# Patient Record
Sex: Female | Born: 1970
Health system: Southern US, Community
[De-identification: ages and names within clinical notes are randomized; demographics above are authoritative.]

## PROBLEM LIST (undated history)

## (undated) DIAGNOSIS — I2699 Other pulmonary embolism without acute cor pulmonale: Secondary | ICD-10-CM

## (undated) DIAGNOSIS — N946 Dysmenorrhea, unspecified: Secondary | ICD-10-CM

## (undated) DIAGNOSIS — F32A Depression, unspecified: Secondary | ICD-10-CM

## (undated) DIAGNOSIS — I10 Essential (primary) hypertension: Secondary | ICD-10-CM

## (undated) DIAGNOSIS — D649 Anemia, unspecified: Secondary | ICD-10-CM

## (undated) DIAGNOSIS — K279 Peptic ulcer, site unspecified, unspecified as acute or chronic, without hemorrhage or perforation: Secondary | ICD-10-CM

## (undated) DIAGNOSIS — K5790 Diverticulosis of intestine, part unspecified, without perforation or abscess without bleeding: Secondary | ICD-10-CM

## (undated) DIAGNOSIS — Z8719 Personal history of other diseases of the digestive system: Secondary | ICD-10-CM

## (undated) DIAGNOSIS — K2270 Barrett's esophagus without dysplasia: Secondary | ICD-10-CM

## (undated) DIAGNOSIS — I82409 Acute embolism and thrombosis of unspecified deep veins of unspecified lower extremity: Secondary | ICD-10-CM

## (undated) DIAGNOSIS — F329 Major depressive disorder, single episode, unspecified: Secondary | ICD-10-CM

## (undated) DIAGNOSIS — K219 Gastro-esophageal reflux disease without esophagitis: Secondary | ICD-10-CM

## (undated) DIAGNOSIS — T7840XA Allergy, unspecified, initial encounter: Secondary | ICD-10-CM

## (undated) DIAGNOSIS — Z98891 History of uterine scar from previous surgery: Secondary | ICD-10-CM

## (undated) HISTORY — DX: Major depressive disorder, single episode, unspecified: F32.9

## (undated) HISTORY — PX: OTHER SURGICAL HISTORY: SHX169

## (undated) HISTORY — PX: TONSILLECTOMY: SUR1361

## (undated) HISTORY — PX: TUBAL LIGATION: SHX77

## (undated) HISTORY — PX: ESOPHAGOGASTRODUODENOSCOPY: SHX1529

## (undated) HISTORY — PX: COLONOSCOPY: SHX174

## (undated) HISTORY — DX: Allergy, unspecified, initial encounter: T78.40XA

## (undated) HISTORY — DX: Gastro-esophageal reflux disease without esophagitis: K21.9

## (undated) HISTORY — PX: ABDOMINAL HYSTERECTOMY: SHX81

## (undated) HISTORY — DX: Depression, unspecified: F32.A

---

## 1998-01-08 HISTORY — PX: LEEP: SHX91

## 2004-08-06 ENCOUNTER — Emergency Department: Payer: Self-pay | Admitting: Emergency Medicine

## 2004-08-16 ENCOUNTER — Ambulatory Visit: Payer: Self-pay | Admitting: Otolaryngology

## 2005-12-12 ENCOUNTER — Ambulatory Visit: Payer: Self-pay | Admitting: Family Medicine

## 2008-05-11 ENCOUNTER — Ambulatory Visit: Payer: Self-pay | Admitting: Obstetrics & Gynecology

## 2008-09-20 ENCOUNTER — Encounter: Payer: Self-pay | Admitting: Obstetrics and Gynecology

## 2008-10-08 ENCOUNTER — Encounter: Payer: Self-pay | Admitting: Obstetrics and Gynecology

## 2008-11-25 ENCOUNTER — Encounter: Payer: Self-pay | Admitting: Obstetrics and Gynecology

## 2008-11-29 ENCOUNTER — Encounter: Payer: Self-pay | Admitting: Maternal and Fetal Medicine

## 2008-12-09 ENCOUNTER — Encounter: Payer: Self-pay | Admitting: Maternal & Fetal Medicine

## 2008-12-16 ENCOUNTER — Encounter: Payer: Self-pay | Admitting: Obstetrics & Gynecology

## 2008-12-20 ENCOUNTER — Encounter: Payer: Self-pay | Admitting: Obstetrics and Gynecology

## 2008-12-27 ENCOUNTER — Encounter: Payer: Self-pay | Admitting: Maternal & Fetal Medicine

## 2008-12-30 ENCOUNTER — Encounter: Payer: Self-pay | Admitting: Maternal & Fetal Medicine

## 2009-01-03 ENCOUNTER — Encounter: Payer: Self-pay | Admitting: Obstetrics and Gynecology

## 2009-01-06 ENCOUNTER — Observation Stay: Payer: Self-pay | Admitting: Obstetrics & Gynecology

## 2009-01-06 ENCOUNTER — Encounter: Payer: Self-pay | Admitting: Maternal & Fetal Medicine

## 2009-01-08 ENCOUNTER — Encounter: Payer: Self-pay | Admitting: Maternal & Fetal Medicine

## 2009-01-13 ENCOUNTER — Encounter: Payer: Self-pay | Admitting: Obstetrics and Gynecology

## 2009-01-19 ENCOUNTER — Inpatient Hospital Stay: Payer: Self-pay | Admitting: Obstetrics & Gynecology

## 2011-03-25 LAB — HM PAP SMEAR: HM Pap smear: NORMAL

## 2011-04-16 LAB — HM MAMMOGRAPHY: HM MAMMO: NORMAL

## 2013-05-08 DIAGNOSIS — Z86711 Personal history of pulmonary embolism: Secondary | ICD-10-CM | POA: Insufficient documentation

## 2013-05-08 DIAGNOSIS — Z86718 Personal history of other venous thrombosis and embolism: Secondary | ICD-10-CM | POA: Insufficient documentation

## 2013-05-12 ENCOUNTER — Inpatient Hospital Stay: Payer: Self-pay | Admitting: Internal Medicine

## 2013-05-12 LAB — IRON AND TIBC
Iron Bind.Cap.(Total): 508 ug/dL — ABNORMAL HIGH (ref 250–450)
Iron Saturation: 3 %
Iron: 15 ug/dL — ABNORMAL LOW (ref 50–170)
Unbound Iron-Bind.Cap.: 493 ug/dL

## 2013-05-12 LAB — PROTIME-INR
INR: 1
Prothrombin Time: 12.8 secs (ref 11.5–14.7)

## 2013-05-12 LAB — COMPREHENSIVE METABOLIC PANEL
ALBUMIN: 3.4 g/dL (ref 3.4–5.0)
ALT: 11 U/L — AB (ref 12–78)
Alkaline Phosphatase: 57 U/L
Anion Gap: 6 — ABNORMAL LOW (ref 7–16)
BUN: 8 mg/dL (ref 7–18)
Bilirubin,Total: 0.2 mg/dL (ref 0.2–1.0)
CHLORIDE: 103 mmol/L (ref 98–107)
Calcium, Total: 8.6 mg/dL (ref 8.5–10.1)
Co2: 27 mmol/L (ref 21–32)
Creatinine: 0.66 mg/dL (ref 0.60–1.30)
EGFR (African American): >60
EGFR (Non-African Amer.): >60
GLUCOSE: 88 mg/dL (ref 65–99)
Osmolality: 270 (ref 275–301)
Potassium: 3.2 mmol/L — ABNORMAL LOW (ref 3.5–5.1)
SGOT(AST): 23 U/L (ref 15–37)
SODIUM: 136 mmol/L (ref 136–145)
Total Protein: 7.7 g/dL (ref 6.4–8.2)

## 2013-05-12 LAB — CBC
HCT: 20.8 % — AB (ref 35.0–47.0)
HGB: 5.9 g/dL — ABNORMAL LOW (ref 12.0–16.0)
MCH: 15.9 pg — ABNORMAL LOW (ref 26.0–34.0)
MCHC: 28.1 g/dL — AB (ref 32.0–36.0)
MCV: 57 fL — ABNORMAL LOW (ref 80–100)
Platelet: 290 10*3/uL (ref 150–440)
RBC: 3.67 10*6/uL — AB (ref 3.80–5.20)
RDW: 19 % — AB (ref 11.5–14.5)
WBC: 8.9 10*3/uL (ref 3.6–11.0)

## 2013-05-12 LAB — APTT: Activated PTT: <23 secs — ABNORMAL LOW (ref 23.6–35.9)

## 2013-05-12 LAB — TROPONIN I

## 2013-05-12 LAB — PRO B NATRIURETIC PEPTIDE: B-TYPE NATIURETIC PEPTID: 144 pg/mL — AB (ref 0–125)

## 2013-05-12 LAB — RETICULOCYTES
ABSOLUTE RETIC COUNT: 0.0716 10*6/uL (ref 0.019–0.186)
Reticulocyte: 2.09 % (ref 0.4–3.1)

## 2013-05-12 LAB — HEMOGLOBIN: HGB: 6.2 g/dL — AB (ref 12.0–16.0)

## 2013-05-12 LAB — FERRITIN: Ferritin (ARMC): 5 ng/mL — ABNORMAL LOW (ref 8–388)

## 2013-05-13 LAB — CBC WITH DIFFERENTIAL/PLATELET
BASOS PCT: 0.6 %
Basophil #: 0.1 10*3/uL (ref 0.0–0.1)
EOS ABS: 0 10*3/uL (ref 0.0–0.7)
Eosinophil %: 0.3 %
HCT: 26.2 % — ABNORMAL LOW (ref 35.0–47.0)
HGB: 7.9 g/dL — ABNORMAL LOW (ref 12.0–16.0)
Lymphocyte #: 1.8 10*3/uL (ref 1.0–3.6)
Lymphocyte %: 16.6 %
MCH: 18.8 pg — AB (ref 26.0–34.0)
MCHC: 30 g/dL — ABNORMAL LOW (ref 32.0–36.0)
MCV: 63 fL — ABNORMAL LOW (ref 80–100)
Monocyte #: 0.6 x10 3/mm (ref 0.2–0.9)
Monocyte %: 5.5 %
Neutrophil #: 8.3 10*3/uL — ABNORMAL HIGH (ref 1.4–6.5)
Neutrophil %: 77 %
Platelet: 272 10*3/uL (ref 150–440)
RBC: 4.17 10*6/uL (ref 3.80–5.20)
RDW: 26.6 % — ABNORMAL HIGH (ref 11.5–14.5)
WBC: 10.8 10*3/uL (ref 3.6–11.0)

## 2013-05-13 LAB — BASIC METABOLIC PANEL
ANION GAP: 7 (ref 7–16)
BUN: 8 mg/dL (ref 7–18)
CALCIUM: 8.4 mg/dL — AB (ref 8.5–10.1)
CREATININE: 0.76 mg/dL (ref 0.60–1.30)
Chloride: 105 mmol/L (ref 98–107)
Co2: 25 mmol/L (ref 21–32)
EGFR (African American): >60
EGFR (Non-African Amer.): >60
GLUCOSE: 106 mg/dL — AB (ref 65–99)
OSMOLALITY: 273 (ref 275–301)
Potassium: 3.1 mmol/L — ABNORMAL LOW (ref 3.5–5.1)
SODIUM: 137 mmol/L (ref 136–145)

## 2013-05-13 LAB — APTT
ACTIVATED PTT: 55.8 s — AB (ref 23.6–35.9)
ACTIVATED PTT: 80.7 s — AB (ref 23.6–35.9)

## 2013-05-13 LAB — TSH: Thyroid Stimulating Horm: 2.53 u[IU]/mL

## 2013-05-14 LAB — APTT: ACTIVATED PTT: 92.1 s — AB (ref 23.6–35.9)

## 2013-05-15 LAB — CA 125: CA 125: 11 U/mL (ref 0.0–34.0)

## 2013-05-15 LAB — CANCER ANTIGEN 27.29: CA 27.29: 11.7 U/mL (ref 0.0–38.6)

## 2013-05-15 LAB — CANCER ANTIGEN 19-9: CA 19-9: 1 U/mL (ref 0–35)

## 2013-05-15 LAB — CEA: CEA: 1.1 ng/mL (ref 0.0–4.7)

## 2013-05-18 LAB — PROT IMMUNOELECTROPHORES(ARMC)

## 2013-05-27 HISTORY — PX: ENDOMETRIAL BIOPSY: SHX622

## 2013-05-28 ENCOUNTER — Ambulatory Visit: Payer: Self-pay | Admitting: Internal Medicine

## 2013-05-28 LAB — CBC CANCER CENTER
BASOS PCT: 0.4 %
Basophil #: 0 x10 3/mm (ref 0.0–0.1)
Eosinophil #: 0.1 x10 3/mm (ref 0.0–0.7)
Eosinophil %: 1.3 %
HCT: 30.7 % — ABNORMAL LOW (ref 35.0–47.0)
HGB: 9.6 g/dL — ABNORMAL LOW (ref 12.0–16.0)
Lymphocyte #: 1.6 x10 3/mm (ref 1.0–3.6)
Lymphocyte %: 22.4 %
MCH: 21.7 pg — AB (ref 26.0–34.0)
MCHC: 31.2 g/dL — ABNORMAL LOW (ref 32.0–36.0)
MCV: 70 fL — ABNORMAL LOW (ref 80–100)
Monocyte #: 0.4 x10 3/mm (ref 0.2–0.9)
Monocyte %: 6.1 %
NEUTROS ABS: 5.1 x10 3/mm (ref 1.4–6.5)
Neutrophil %: 69.8 %
PLATELETS: 433 x10 3/mm (ref 150–440)
RBC: 4.42 10*6/uL (ref 3.80–5.20)
RDW: 35.7 % — AB (ref 11.5–14.5)
WBC: 7.4 x10 3/mm (ref 3.6–11.0)

## 2013-06-08 ENCOUNTER — Ambulatory Visit: Payer: Self-pay | Admitting: Internal Medicine

## 2013-06-22 ENCOUNTER — Ambulatory Visit: Payer: Self-pay | Admitting: Obstetrics and Gynecology

## 2013-06-22 LAB — CBC
HCT: 34.8 % — ABNORMAL LOW (ref 35.0–47.0)
HGB: 11.1 g/dL — ABNORMAL LOW (ref 12.0–16.0)
MCH: 25.1 pg — ABNORMAL LOW (ref 26.0–34.0)
MCHC: 32 g/dL (ref 32.0–36.0)
MCV: 78 fL — ABNORMAL LOW (ref 80–100)
Platelet: 317 10*3/uL (ref 150–440)
RBC: 4.44 10*6/uL (ref 3.80–5.20)
RDW: 27.4 % — ABNORMAL HIGH (ref 11.5–14.5)
WBC: 6.8 10*3/uL (ref 3.6–11.0)

## 2013-07-02 ENCOUNTER — Ambulatory Visit: Payer: Self-pay | Admitting: Obstetrics and Gynecology

## 2013-07-13 ENCOUNTER — Ambulatory Visit: Payer: Self-pay | Admitting: Gastroenterology

## 2013-07-16 ENCOUNTER — Ambulatory Visit: Payer: Self-pay | Admitting: Internal Medicine

## 2013-07-20 ENCOUNTER — Ambulatory Visit: Payer: Self-pay | Admitting: Gastroenterology

## 2013-08-08 ENCOUNTER — Ambulatory Visit: Payer: Self-pay | Admitting: Internal Medicine

## 2013-10-27 LAB — LIPID PANEL
Cholesterol: 194 mg/dL (ref 0–200)
HDL: 81 mg/dL — AB (ref 35–70)
LDL CALC: 97 mg/dL
TRIGLYCERIDES: 82 mg/dL (ref 40–160)

## 2013-10-27 LAB — HEMOGLOBIN A1C: Hgb A1c MFr Bld: 5.9 % (ref 4.0–6.0)

## 2013-12-14 ENCOUNTER — Ambulatory Visit: Payer: Self-pay | Admitting: Internal Medicine

## 2014-01-08 ENCOUNTER — Ambulatory Visit: Payer: Self-pay | Admitting: Internal Medicine

## 2014-05-01 NOTE — H&P (Signed)
PATIENT NAME:  Brooke Page, Brooke Page MR#:  [id number] DATE OF BIRTH:  1970-09-11  DATE OF ADMISSION:  05/12/2013  PRIMARY CARE PHYSICIAN: [facility] (It was Dr. Laurey Morale. She does see the PA over there, Alicia Copland)  CHIEF COMPLAINT: Leg pain.   HISTORY OF PRESENT ILLNESS: This is a 44 year old female with history of vitamin D deficiency. She presents with leg pain since Sunday on her car ride over to [facility]. That is a long ride. She has been having leg pain ever since. Her initial leg pain started back in January but with some elevation it went away. She has also been extremely tired for the last few months. She does have very heavy menstrual bleeding. She bleeds for 5 to 6 days, goes through 7 to 8 pads and tampons. She uses them both during that time frame. No complaints of shortness of breath or chest pain. She does have a hard cough. She has been eating ice for the past 6 months, three 16 ounce cups. Leg pain is described as 9/10 in intensity. In the ER, she was found to have a DVT in the left lower extremity and a pulmonary embolism and severe anemia with a hemoglobin of 5.9. Hospitalist services were contacted for further evaluation.   PAST MEDICAL HISTORY: Vitamin D deficiency, anemia in the past with pregnancy but none since.   PAST SURGICAL HISTORY: Two C-sections, tonsillectomy, wisdom teeth.   ALLERGIES: CEFTIN.   MEDICATIONS: As per prescription writer, aspirin 81 mg daily, Claritin p.r.n., vitamin D 2000 international units daily.   SOCIAL HISTORY: No smoking, occasional alcohol. No drug use. Works doing office work, does work at [employer].   FAMILY HISTORY: Brother died at age 63. He was obese and had blood clots. Father with diabetes and hypertension. Mother with breast cancer and hypertension.   REVIEW OF SYSTEMS: CONSTITUTIONAL: Positive for night sweats. Positive for fatigue. Positive for weight loss, 50 pounds over the past 2 years.  EYES: She does wear contacts.   EARS, NOSE, MOUTH AND THROAT: Positive for runny nose, occasional dysphagia where food gets stuck midway down.  CARDIOVASCULAR: No chest pain. No palpitations.  RESPIRATORY: No shortness of breath. Positive for cough. No sputum. No hemoptysis.  GASTROINTESTINAL: Positive for occasional flank pain, occasional diarrhea. No bright red blood per rectum. No melena.  GENITOURINARY: Positive for heavy menses lasting 5 to 6 days. Goes through 7 to 8 pads and tampons during day 3 and 4. No burning on urination. No hematuria.  MUSCULOSKELETAL: Positive for left leg pain and right finger locking on her.  PSYCHIATRIC: No anxiety or depression.  ENDOCRINE: No thyroid problems.  HEMATOLOGIC AND LYMPHATIC: History of anemia in the past.   PHYSICAL EXAMINATION: VITAL SIGNS: Temperature 98, pulse 107, respirations 20, blood pressure 171/86, pulse ox 100% on room air.  GENERAL: No respiratory distress.  EYES: Conjunctivae pale. Lids normal. Pupils equal, round, and reactive to light. Extraocular muscles intact. No nystagmus.  EARS, NOSE, MOUTH AND THROAT: Tympanic membranes: No erythema. Nasal mucosa: No erythema. Throat: No erythema. No exudate seen. Lips and gums: No lesions.  NECK: No JVD. No bruits. No lymphadenopathy. No thyromegaly. No thyroid nodules palpated.  LUNGS: Clear to auscultation. No use of accessory muscles to breathe. No rhonchi, rales, or wheeze heard.  CARDIOVASCULAR: S1 and S2 tachycardic. No gallops, rubs, or murmurs heard. Carotid upstroke 2+ bilaterally. No bruits. Dorsalis pedis pulses 2+ bilaterally. Positive edema of left lower extremity.  ABDOMEN: Soft, nontender. No organosplenomegaly. Normoactive  bowel sounds. No masses felt.  LYMPHATIC: No lymph nodes in the neck.  MUSCULOSKELETAL: Trace edema of the left lower extremity. No clubbing. No cyanosis.  SKIN: No ulcers or lesions seen.  NEUROLOGIC: Cranial nerves II through XII grossly intact. Deep tendon reflexes 1+ bilateral lower  extremities.  PSYCHIATRIC: The patient is oriented to person, place, and time.   DIAGNOSTIC DATA: White blood cell count 8.9, H and H 5.9 and 20.8, platelet count 290. Glucose 88, BUN 8, creatinine 0.66, sodium 136, potassium 3.2, chloride 103, CO2 27, calcium 8.6. Liver function tests normal range. PT 12.8. INR 1. Troponin negative.   Ultrasound of the left lower extremity showed occlusive popliteal and distal femoral vein DVT.   CT scan of the chest shows acute nonocclusive right-sided pulmonary emboli involving the right lower lobe, segmental pulmonary arteries and right upper lobe lobar artery. No evidence of heart strain or pulmonary infarct. Large hiatal hernia.  ASSESSMENT AND PLAN: 1.  Symptomatic anemia with pica ice eating. ER physician consented for blood transfusion and ordered 1 unit of packed red blood cells. We will send off iron studies before transfusion and start ferrous sulfate. Likely this is secondary to severe menstrual bleeding. Will consult [facility]. I advised the patient not to ice eat. This can cause symptomatic anemia also.  2.  Deep vein thrombosis left lower extremity with severe pain and pulmonary embolism. IV heparin ordered by ER physician. Will send off hypercoagulable work-up. I will get a vascular consultation to consider a filter because I am not sure how good of a candidate this patient is for anticoagulation with her severe anemia and heavy menstrual bleeding. Will start IV heparin for now.  3.  Hypokalemia. Will replace potassium orally.  4.  Allergic rhinitis. Continue Claritin.  5.  Vitamin D deficiency. Continue vitamin D supplementation.   TIME SPENT ON ADMISSION: 55 minutes.   ____________________________ Tana Conch. Leslye Peer, MD rjw:sb D: 05/12/2013 15:30:30 ET T: 05/12/2013 16:57:46 ET JOB#: [id number]  cc: Tana Conch. Leslye Peer, MD, <Dictator> Deirdre Evener. Copland, PA Marisue Brooklyn MD ELECTRONICALLY SIGNED 05/17/2013 14:46

## 2014-05-01 NOTE — Consult Note (Signed)
Consulting Department: MedicinePhysician: Loletha Grayer MD Consulting Question: Menorrhagia History of Present Illness: 44 year old [id number] initially presenting to the ER today for evaluation of increasing left lower extremity pain, worsening over the past few days, starting on 05/09/2013. Patient found to have left lower extremity DVT as well as PE.  In addition patient was noted to be anemic.  She reports heavier periods over the last two years, occurring at regular monthly 28-30 day intervals, 6-7 days duration, going through 7-8 tampons + pads a day, with passage of clots.  Denies moliminal symptoms preceding menses.  She is currently on no hormonal contraceptives. Review of Systems: 10 point review of systems negative unless otherwise noted in HPI Past Medical History: 1) Anemia 2) Pulmonary embolism (current admission) 3) Obesity 4) CIN II Past Surgical History: 1) C-section x 3 2) LEEP 11/1998 3) Tonsillectomy 4) Bilateral tubal ligation Obstetric History: G5P122402/07/1992 preterm vaginal delivery, preeclampsia, female 3lbs04/07/1997 C-section, preeclampsia, female 4lbs02/02/2008 SAB03/01/2008 SAB01/12/2009 C-section, preeclampsia, twins Gynecologic History: LMP 04/22/2013.  Pap smear 04/06/2011 negative for intraepithelial lesion or malignancy with negative HPV.  Leep 11/24/2009 CIN I-II Family History: noteable for breast cancer, diabetes, and hypertension.  Mother also had colon cancer in her 38's the patient has not had a colonoscopy Social History: Denies tobacco, ETOH, or illicit drug use Allergies:  1) Ceftin (Rash) Home Medications: none Physical Exam:VitalNADnormocephalic anictericRRRno increased rate of breathingNABS, soft, non-tender, non-distended, no rebound no guarding, old midline verticle scardeferredLLE edema +1, dorsalis pedis pulses 2+ bilaterally mood appropriate, affect full Labs: 05/12/2013 12:54 WBC 8.9K, H&H 5.9 & 20.8, platelets 290K05/05/2013 12:54 Na 136, K  3.2, Cl 103, CO2 27, BUN 8, Cr 0.66, ALT 11, AST 23 Imaging:extremity Doppler 05/12/2013 11:08 Occlusive popliteal and distal femoral vein DVT left lower extremityangiogram 05/12/2013 right lower lobe segmental pulmonary embolism  Assessment: 44 year old [id number] currently admitted for treatment of pulmonary embolism and left lower extremity DVT, also noted have menorrhagia to anemia requiring transfusion  Plan: 1) Abnormal uterine bleeding ? in setting of active PE/DVT recommend progestin only management.  Per ACOG Committee Opinion #557 April  2013 "Management of Acute Abnormal Uterine Bleeding in Nonpregnant Reproductive-Age Women"  will start on medroxyprogesterone 20mg  po tid x 1 week then 20mg  po daily thereafter.  This should lead to cessation in bleeding within the next 1-2 days.  The most likely diagnosis is abnormal uterine bleeding secondary to anovulation.  Patient not currently bleeding but this regimen should also prevent her from bleeding with her next menses. - Check TSH - Check prolactin - Obtain TVUS (this may be obtained in the outpatient setting as will not change management at present) - Obtain in office endometrial biopsy - We did discuss long term management option including po or IM progestin, Mirena IUD, endometrial ablation pending normal uterine cavity and no structural abnormalities underlying her AUB 2) PE/DVT ? anticoagulation pre primary team 3) Anemia - agree with transfusion of 1U pRBC.  As the patient has a positive family history of colon cancer in her mother while she was in her 59s, and that fact that she is African American would consider colonocsopy for work up anemia.  We discussed that there was a link between endometrial cancer and colon cancer/lynch syndrome    Electronic Signatures: Dorthula Nettles (MD)  (Signed on 05-May-15 21:38)  Authored  Last Updated: 05-May-15 21:38 by Dorthula Nettles (MD)

## 2014-05-01 NOTE — Consult Note (Signed)
PATIENT NAME:  Brooke Page, Brooke Page MR#:  [id number] DATE OF BIRTH:  [birth year]  DATE OF CONSULTATION:  05/13/2013  REFERRING PHYSICIAN:  Dr. Anselm Jungling CONSULTING PHYSICIAN:  Theodore Demark, NP  REASON FOR CONSULTATION: GI consult ordered by Dr. Anselm Jungling for evaluation of anemia.   HISTORY OF PRESENT ILLNESS: I appreciate consult for 44 year old [place] American woman admitted with DVT/PE, status post IVC filter placement for evaluation of IDA. American woman admitted with DVT/PE, status post IVC filter placement for evaluation of IDA. Also has history of significant metromenorrhagia and has had a GYN consult with a planned workup. Mother had history of colon cancer at age 8. The patient states this with localized to a single polyp and that her mother also had breast cancer. The patient has never had colonoscopy. GI-wise, the patient reports intermittent left upper quadrant pain, heartburn and intermittent dysphagia over the last couple of years. States dysphagia located in the mid anterior neck and is generally with the first bite of any type of food. This will pass with a little bit of time. She states she has been using ibuprofen frequently for recent leg pain. States a history of intermittent lower abdominal discomfort with irritable bowel habits ever since she can remember. Denies black, tarry, bloody stools, further GI complaints.   PAST MEDICAL HISTORY: Vitamin D deficiency, anemia with pregnancy.   PAST SURGICAL HISTORY: Two C-sections, tonsillectomy, wisdom tooth removal.  ALLERGIES:  Ceftin  MEDICATIONS:  Aspirin 81 mg p.o. daily, Claritin p.r.n., vitamin D 2000 international units p.o. daily.   SOCIAL HISTORY: No tobacco, rare EtOH. No drugs. 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   FAMILY HISTORY: Mother with colon cancer and breast cancer. No known family history of GYN cancers, brain, stomach or small intestine cancers. Significant additionally for diabetes, hypertension and brother who had blood clots. No known liver disease, ulcers.  REVIEW OF SYSTEMS: Ten systems reviewed.  Significant for fatigue, has lost weight, however states this has not been an unplanned loss. She has been working on this. Occasional congestion, intermittent cough, leg pain as noted on admit consult and heavy menses as noted above. Otherwise, systems review is unremarkable.   LABORATORY DATA: Most recent labs: Glucose 106, iron 15, ferritin 5, iron saturation 3%. TIBC 528, BUN 8, creatinine 0.76. Sodium 137, potassium 3.1, GFR greater than 60, calcium 8.4. Liver panel is really unremarkable with the exception of slightly elevated total protein. Troponin negative. TSH normal. WBC 10.8, hemoglobin 7.9, hematocrit 26.2. Red cells are microcytic. PT-INR normal. Her PTT is 80.7. She is currently being anticoagulated with heparin. B12 normal.  Protein CNS normal. Anticardiolipin antibodies and beta-2 glycoprotein levels are pending. Had a recent echocardiogram with a normal LVEF.   PHYSICAL EXAMINATION: VITAL SIGNS: Most recent: Temperature 98, pulse 87, respiratory rate 18, blood pressure 115/79, SaO2 100% on room air.  GENERAL: Pleasant, well-appearing young lady in no acute distress.  HEENT: Normocephalic, atraumatic. Sclerae clear.  NECK: Supple. No JVD, lymphadenopathy, thyromegaly.  CHEST: Lungs clear to auscultation. Respirations eupneic.  CARDIOVASCULAR: S1, S2. RRR. No MRG. No edema. Peripheral pulses palpable.  ABDOMEN: Flat, soft. Bowel sounds x 4, nondistended, nontender. No hepatosplenomegaly, rebound signs, peritoneal signs, masses or other abnormalities.  RECTAL: Deferred due to her current anticoagulation.  SKIN: Warm, dry, pink. No erythema, lesion or rash.  NEUROLOGICAL: Alert, oriented x 3. Cranial nerves II through XII intact. Speech clear. No facial droop.  PSYCHIATRIC: Pleasant, calm, cooperative, good insight.   IMPRESSION AND PLAN: Iron deficiency anemia, dyspepsia, dysphagia, likely irritable bowel syndrome. Family history of colon cancer.  Recommend  avoiding NSAIDs for now as  she has been using this frequently due to her leg pain. Recommend proton pump inhibitor therapy. I did discuss with Dr. Gustavo Lah regarding her endoscopy, as she at some point will need a colonoscopy and EGD. Her deep vein thrombosis, pulmonary embolus, anticoagulation makes her high risk for invasive sedated procedures. So we will try to plan this when it is clinically feasible. It potentially may be done as an outpatient. Her heavy menses may also contribute to her iron deficiency anemia. Her last period was 04/22/2013.  Thank you very much for this consult.  These services were provided by Stephens November, MSN, [facility], in collaboration with Loistine Simas, M.D. with whom I have discussed this patient in full.   ____________________________ Theodore Demark, NP chl:ce D: 05/13/2013 17:42:34 ET T: 05/13/2013 17:59:15 ET JOB#: [id number]  cc: Theodore Demark, NP, <Dictator> [place] SIGNED 05/26/2013 16:02

## 2014-05-01 NOTE — Op Note (Signed)
PATIENT NAME:  Brooke Page, Brooke Page MR#:  [id number] DATE OF BIRTH:  [birth year]  DATE OF PROCEDURE:  07/02/2013  PREOPERATIVE DIAGNOSIS: Menorrhagia.   POSTOPERATIVE DIAGNOSIS: Menorrhagia.  PROCEDURE PERFORMED: Hysteroscopy and NovaSure endometrial ablation.   PRIMARY SURGEON: Stoney Bang. Georgianne Fick, MD  ANESTHESIA USED: General.    ESTIMATED BLOOD LOSS: 10 mL.  OPERATIVE FLUIDS: 750 of crystalloid.   URINE OUTPUT: 100 mL clear urine.   COMPLICATIONS: None.   INTRAOPERATIVE FINDINGS: Normal cavity, normal tubal ostia, normal cervix. Sounding length of 12 cm, cervical length 6.5 cm for a cavity length of 5.5 cm. The cavity width was 5 cm and the burn time was 1 minute 15 seconds. Post-ablation hysteroscopy revealed good coverage of the entire cavity and sparing of the endocervix.   SPECIMENS REMOVED: None.   PATIENT CONDITION FOLLOWING PROCEDURE: Stable.   PROCEDURE IN DETAIL: Risks, benefits, and alternatives were discussed with the patient prior to proceeding to the Operating Room. The patient was taken to the Operating Room where she was placed under general endotracheal anesthesia. She was positioned in the dorsal lithotomy position using Allen stirrups, prepped and draped in the usual sterile fashion. A timeout procedure was performed. Attention was turned to the patient's pelvis. The bladder was straight catheterized with a red rubber catheter. An operative speculum was placed. The anterior lip of the cervix was grasped with a single-tooth tenaculum and sequentially dilated using Pratt dilators. A hysteroscope was then advanced, noting a normal endometrial cavity and cervix. The hysteroscope was removed. The uterus was sounded to 12 cm. Cervical length was noted to be 6.5 cm. Upon placing the NovaSure device into the cavity, the cavity width was noted to be 5 cm. The NovaSure device was activated and passed cavity assessment before starting the burn, which last 1 minute and 15 seconds.  Following this, the NovaSure was removed. Second look hysteroscopy revealed good coverage of the entire uterine cavity with sparing of the endocervix. The tenaculum was removed as was the hysteroscope. Tenaculum sites and cervix were hemostatic. The speculum was removed. Sponge, needle, and instrument counts were correct x2. The patient tolerated the procedure well and was taken to the recovery room in stable condition.    ____________________________ Stoney Bang. Georgianne Fick, MD ams:lm D: 07/06/2013 16:07:32 ET T: 07/07/2013 04:55:13 ET JOB#: [id number]  cc: Stoney Bang. Georgianne Fick, MD, <Dictator> Dorthula Nettles MD ELECTRONICALLY SIGNED 07/11/2013 8:08

## 2014-05-01 NOTE — Consult Note (Signed)
Brief Consult Note: Diagnosis: 1. dvt and pe  2. night sweats   3. iron deficient anemia.   Patient was seen by consultant.   Discussed with Attending MD.   Comments: DISCUSSED WITH MEDICINE AND GI  DICTATED NOTE TO FOLLOW EXAM WAS BENIGN. NO ACUTE SYMPTOMS, LEFT LEG MUCH SMALLER AND LESS TENDER AS PER PATIENT VS YESTERDAY. NO ABDO PAIN, DARK STOOLS, NO CURRENT UGI SYMPTOMS, SOME HX OF INTERMITTENT ABDO SYMPTOMS HAS BEEN REVIEWD BY GI. ALSO 3 WEEKS OF INTERMITENT, MOST BUT NOT ALL NIGNTS, OF NIGHT SWEATS,NEVER HAD BEFORE. GYN HX OF REGULAR HEAVY BLEEDING FOR YRS, SLOWLY PROGRESSIVE SEAKNESS  BASELINE CHEMISTRIES NORMAL. PT NORMAL . PTT, LESS THAN 23SEC. CBC UNREMARKABLE, POLYS SLIGHTLY HIGH, LIKELY REACTIVE. CLOT SYMPTOMS DEVELOPD 24HRS AND PROGRESSED TO CLINICASLLY APPARANT BY 48 HRS, AFTER TAKING CAR TRIP 2HRS EACH WAY. IN [place] THIS YEAR, EXPERIENCED SIMILAR CALF PAIN, BUT THEN RESOLVED AFTER 5 DAYS, AFTER THE SAME TRIP. FH BROTHER BLOOD CLOT, BUT HE WAS IMMOBILE AND OVER 600LBS. FH DOCUMENTED COLON CANCER IN POLYP MOTHER AGE 66, MOTHER ALSO HAD BREAST CANCER.  IMP CLOT, SHORT CAR TRIP TEMPORALLY PRECIPITATING BUT A SOFT EVENT. NEEDS HYPERCOAG W/U, PLUS AGE APPROPRIATE CANCER SCREENING, PLUS FOR GYN ISSUES NEEDS PELIC U/S WITH TRANSVAGINAL, AND FOR NIGHT SWEATS, WITH CURRENT ISSUES, WILL NEED CT ABDOMEN AND PELVIS TO R/O LYMPHOMA. NOTE S/P IVC FILTER PLACED.   THEREFORE NEEDS MULTIPLE STUDIES...Marland Kitchen1. WOULD SWITCH TO XARALTO, AND  W/U CAN BE OUT PATIENT  2. ADD LUPUS INHIBITOR, SIEP, ANA , FUNCTIONAL PROTEIN C AND S TESTING,  AND LATER MAY ADD AT3, TO COAG W/U.  3. ADD TUMOR MARKERS TO W/U, CA 125, CEA, CA 19/9, CA 27/29 4. LATER RECHECK CBC TO R/O LEUKOCYTOSIS  5.MAMMOGRAM  6. PELVIC U/S WITH TRANSVAGINAL. 7. CT ABDO / PELVIS 8.LATER WILL NEED COLONOSCOPY 9. LATER WILL NEED EGD AS PER GI EVALUATION  10. LATER WILL SUGGEST COLARIS TESTING FOR [facility] SYNDROME DUE TO FH  11. AS PER GYN WILL NEED ENDOMETRIAL BX,  TSH, PROLACTIN  12. WOULD ADVISE AS LITTLE AS POSSIBLE HORMONE TX, BUT CURRENTLY PROTECTED RE PE 13. FOR POTENTIAL LATER USE NOTE MIRENA NOT LINKED TO SYSTEMIC CLOT 14. LENGTH OF ANTICOAG AT LEAST 6 MO..F/U 1 WEEK CANCER CENTER.  Electronic Signatures: Dallas Schimke (MD)  (Signed 06-May-15 18:38)  Authored: Brief Consult Note   Last Updated: 06-May-15 18:38 by Dallas Schimke (MD)

## 2014-05-01 NOTE — Consult Note (Signed)
Brief Consult Note: Diagnosis: Symptomatic anemia.   Patient was seen by consultant.   Consult note dictated.   Comments: Appreciate consult for 44 y/o [place] American woman admitted with DVT/PE for evaluation of IDA. Also has history of significant metromennorhagia and has had GYn cx, with planned work up. Mother had history of colon cancer at age 85- states this was localized to a single polyp,& she also had breast cancer. Pt never had cspy. GI wise pt reports intermittent LUQ pain, heartburn, and intermittent dysphagia over the last couple of years. States dysphagia located in mid anterior neck with any type foods on the1st bite. Has been using Ibuprofen frequently for her recent leg pain. States a history of intermittent lower abdominal discomfort with variable bowel habits every since she can remember.  Denies black/tarry/bloody stools, further GI complaints. On exam abdomen soft, ndnt. Rectal deferred at present as she is anticoagulated on heparin with plans to start Xarelto therapy. Patient denies fh of gyn cancers, brain/stomach/small intestine cancers. Impression and plan: IDA. Dyspepsia. Dysphagia. Likely IBS.  Family history of colon cancer. Recomend avoiding NSAID for now, PPI therapy. Will discuss with Dr Guadlupe Spanish regarding endoscopies. Her DVT/PE/anticoagulation therapy makes her a high risk for invasive sedated procedures presently, although she should have EGD/colonoscopy soon. Her heavy menses may also contribute to her IDA. LMP 04/22/13.  Addendum: spoke with Dr Gustavo Lah: will plan for EGD and colonoscopy on serial days, when clinically feasible.  Electronic Signatures: Stephens November H (NP)  (Signed 06-May-15 15:54)  Authored: Brief Consult Note   Last Updated: 06-May-15 15:54 by Theodore Demark (NP)

## 2014-05-01 NOTE — Op Note (Signed)
PATIENT NAME:  Brooke Page, Brooke Page MR#:  [id number] DATE OF BIRTH:  [birth year]  DATE OF PROCEDURE:  05/12/2013  PREOPERATIVE DIAGNOSES:  1.  Deep vein thrombosis lower extremities.  2.  Pulmonary embolism.  3.  Menometrorrhagia.   POSTOPERATIVE DIAGNOSES: 1.  Deep vein thrombosis lower extremities.  2.  Pulmonary embolism.  3.  Menometrorrhagia.   PROCEDURES:  1.  Inferior venacavogram.  2.  Placement of infrarenal inferior vena cava filter, [place] type.   SURGEON: Katha Cabal, M.D.   SEDATION: Versed 6 mg IV.   CONTRAST USED: Isovue 15 mL.   FLUOROSCOPY TIME: 1.0 minutes.   INDICATIONS: [name] is a 44 year old woman who presented with shortness of breath and was found to have pulmonary embolism. Duplex also demonstrated deep vein thrombosis. She is currently undergoing evaluation for heavy vaginal bleeding and therefore was felt to be a very poor candidate for long-term anticoagulation. The risks and benefits for IVC filter placement were reviewed. All questions have been answered. Discussion regarding removal of the filter when appropriate was also undertaken at this time. Risks and benefits were reviewed. The patient agrees to proceed.   DESCRIPTION OF PROCEDURE: The patient is taken to special procedures and placed in the supine position. After adequate sedation with Versed alone has been achieved, ultrasound is placed in a sterile sleeve. Common femoral vein on the right is identified. It is echolucent and compressible indicating patency. Image is recorded for the permanent record. Under real-time visualization after 1% lidocaine has been infiltrated, access is obtained with a micropuncture needle, microwire followed by micro sheath. J-wire does not allow the passage of the delivery sheath, and therefore a straight glide catheter is advanced, followed by an Amplatz wire delivery sheath now advanced without difficulty and is positioned at the confluence of the iliac veins. AP  projection of the vena cava was performed with the markers from the dilator and measurement is made, the cava measures 21.4 mm in the infrarenal segment. Wire is reintroduced. The sheath is advanced to the L2 level, and the [facility] filter is deployed without difficulty. The sheath is pulled, pressure held, and a safeguard is placed. There are no immediate complications.   INTERPRETATION: The inferior vena cava was opacified with a bolus injection of contrast. There are no filling defects. Renal blushes occur at the L1 level. Cava measures 21.4 as noted above and the [facility] filter is deployed with excellent orientation.   ____________________________ Katha Cabal, MD ggs:lf D: 05/13/2013 08:24:49 ET T: 05/13/2013 10:00:31 ET JOB#: [id number]  cc: Katha Cabal, MD, <Dictator> Katha Cabal MD ELECTRONICALLY SIGNED 06/09/2013 12:26

## 2014-05-01 NOTE — Consult Note (Signed)
Chief Complaint:  Subjective/Chief Complaint Please see full GI consult and brief consult note. Paitent admitted with leg pain, found with dvt and pe.  Positive family h/o colon cancer at a young age.  Currently on anticoagulation and s/p IVC filter.  Patient denies n/v.  Has some abdominal discomfort and heartburn and mild dysphagia in the setting of chronic nsaid use.  Does not take ppi or h2ra at home, though uses antiacids.  Denies black or blood in the stool.   Case discussed with Dr Delana Meyer.  Anemia is likely multifactorial, and per patient has menorrhagia.  No othe r GI sx  no evidence of GI bleeding otherwise.  Currently on anticoagulation and in the setting of new PE woudl be best not interrupted.  Would continue a ppi bid as you are, plan for EGD ande colonoscopy as clinically feasible in several months unless there is indication of overt clinically significant GI bleeding.  Following.   VITAL SIGNS/ANCILLARY NOTES: **Vital Signs.:   06-May-15 06:35  Vital Signs Type Blood Transfusion Complete  Temperature Temperature (F) 98.8  Celsius 37.1  Temperature Source oral  Pulse Pulse 80  Respirations Respirations 20  Systolic BP Systolic BP 099  Diastolic BP (mmHg) Diastolic BP (mmHg) 85  Mean BP 99  Pulse Ox % Pulse Ox % 100  Oxygen Delivery Room Air/ 21 %   Electronic Signatures: Loistine Simas (MD)  (Signed 06-May-15 17:30)  Authored: Chief Complaint, VITAL SIGNS/ANCILLARY NOTES   Last Updated: 06-May-15 17:30 by Loistine Simas (MD)

## 2014-05-01 NOTE — Consult Note (Signed)
Chief Complaint:  Subjective/Chief Complaint seen for anemia.  patient hasd a bad episode of heartburn last night.  better now.  no n/v or abdominalpain.   VITAL SIGNS/ANCILLARY NOTES: **Vital Signs.:   07-May-15 08:17  Vital Signs Type Routine  Temperature Temperature (F) 98.5  Celsius 36.9  Pulse Pulse 90  Systolic BP Systolic BP 010  Diastolic BP (mmHg) Diastolic BP (mmHg) 88  Mean BP 103  Pulse Ox % Pulse Ox % 98  Pulse Ox Activity Level  At rest  Oxygen Delivery Room Air/ 21 %   Brief Assessment:  Cardiac Regular   Respiratory clear BS   Gastrointestinal details normal Soft  Nontender  Nondistended   EXTR positive cyanosis/clubbing, negative cyanosis/clubbing   Lab Results: Routine Coag:  07-May-15 04:43   Activated PTT (APTT)  92.1 (A HCT value >55% may artifactually increase the APTT. In one study, the increase was an average of 19%. Reference: "Effect on Routine and Special Coagulation Testing Values of Citrate Anticoagulant Adjustment in Patients with High HCT Values." American Journal of Clinical Pathology 2006;126:400-405.)   Radiology Results: Cardiology:    06-May-15 11:56, Echo Doppler  Echo Doppler   REASON FOR EXAM:      COMMENTS:       PROCEDURE: [facility] - ECHO DOPPLER COMPLETE(TRANSTHOR)  - May 13 2013 11:56AM     RESULT: Echocardiogram Report    Patient Name:   Brooke Page Date of Exam: 05/13/2013  Medical Rec #:  [id number]               Custom1:  Date of Birth:  22-Apr-1970            Height:       65.0 in  Patient Age:    44 years             Weight:       220.0 lb  Patient Gender: F                    BSA:          2.06 m??    Indications: Pulmonary embolism.  Sonographer:    Sherrie Sport RDCS  Referring Phys: Loletha Grayer, Lenna Sciara    Sonographer Comments: [facility]    Summary:   1. Left ventricular ejection fraction, by visual estimation, is 55 to   60%.   2. Normal global left ventricular systolic function.   3. Moderate concentric left  ventricular hypertrophy.   4. Moderately increased left ventricular septal thickness.   5. Mildly dilated left atrium.   6. Mild mitral valve regurgitation.   7. Moderately increased left ventricular posterior wall thickness.  2D AND M-MODE MEASUREMENTS (normal ranges within parentheses):  Left Ventricle:          Normal  IVSd (2D):      1.19 cm (0.7-1.1)  LVPWd (2D):     1.16 cm (0.7-1.1) Aorta/LA:                  Normal  LVIDd (2D):     4.20 cm (3.4-5.7) Aortic Root (2D): 3.10 cm (2.4-3.7)  LVIDs (2D):     3.01 cm           Left Atrium (2D): 4.80 cm (1.9-4.0)  LV FS (2D):     28.3 %   (>25%)  LV EF (2D):     55.1 %   (>50%)  Right Ventricle:                                    RVd (2D):        8.76 cm  LV DIASTOLIC FUNCTION:  MV Peak E: 1.01 m/s E/e' Ratio: 11.00  MV Peak A: 0.76 m/s Decel Time: 215 msec  E/A Ratio: 1.32  SPECTRAL DOPPLER ANALYSIS (where applicable):  Mitral Valve:  MV P1/2 Time: 62.35 msec  MV Area, PHT: 3.53 cm??  Aortic Valve: AoV Max Vel: 1.50 m/s AoV Peak PG: 9.1 mmHg AoV Mean PG:  LVOT Vmax: 1.10 m/s LVOT VTI:  LVOT Diameter: 2.00 cm  AoV Area, Vmax: 2.30 cm?? AoV Area, VTI:  AoV Area, Vmn:  Tricuspid Valve and PA/RV Systolic Pressure: TR Max Velocity: 2.18 m/s RA   Pressure: 5 mmHg RVSP/PASP: 23.9 mmHg  Pulmonic Valve:  PV Max Velocity: 1.15 m/s PV Max PG: 5.3 mmHg PV Mean PG:    PHYSICIAN INTERPRETATION:  Left Ventricle: The left ventricular internal cavity size was normal. LV   septal wall thickness was moderately increased. LVposterior wall   thickness was moderately increased. Moderate concentric left ventricular   hypertrophy. Global LV systolic function was normal. Left ventricular   ejection fraction, by visual estimation, is 55 to 60%.  Right Ventricle: The right ventricular size is normal.  Left Atrium: The left atrium is mildly dilated.  Mitral Valve: The mitral valve is not well seen. Mild mitral valve    regurgitation is seen.  Tricuspid Valve: The tricuspid valve is not well seen. Trivial tricuspid   regurgitation is visualized. The tricuspid regurgitant velocity is 2.18   m/s, and with an assumed right atrial pressure of 5 mmHg, the estimated   right ventricular systolic pressure is normal at 23.9 mmHg.  Aortic Valve: The aortic valve is tricuspid. The aortic valve is   structurally normal, with no evidence of sclerosis or stenosis. No   evidence of aortic valve regurgitation is seen.    [place] MD  Electronically signed by [id number] Bill MD  Signature Date/Time: 05/13/2013/12:53:16 PM  *** Final ***    IMPRESSION: .        Verified By: Teodoro Spray, M.D., MD   Assessment/Plan:  Assessment/Plan:  Assessment 1) anemia, iron def- patient with menorrhagia.  likely multifactorial.  From a GI standpoint, she has frequent symptomatic GERD and a fhx of colon cancer in a primary relative.  She is currently anticoagulated for PE/DVT.  Would continue treatment and GI will see in 3 weeks as o/p to consider for egd and colonoscopy in about 6 weeks as clinically feasible.  Proceedures will need to be off anticoagulation and may require lovenox bridge.  Will discuss further with Dr Inez Pilgrim. Continue bid ppi for GERD.   Plan as above, will sign off reconsult as needed.   Electronic Signatures: Loistine Simas (MD)  (Signed 07-May-15 10:29)  Authored: Chief Complaint, VITAL SIGNS/ANCILLARY NOTES, Brief Assessment, Lab Results, Radiology Results, Assessment/Plan   Last Updated: 07-May-15 10:29 by Loistine Simas (MD)

## 2014-05-01 NOTE — Discharge Summary (Signed)
Dates of Admission and Diagnosis:  Date of Admission 12-May-2013   Date of Discharge 14-May-2013   Admitting Diagnosis anemia, PE   Final Diagnosis Symptomatic anemia- due to manorrhagia- need IUD - per Gyn DVT and PE Hx of Cancerous polyp in mother at age 42, need Gi work up in next 2-3 weeks. hypokalemia    Chief Complaint/History of Present Illness a 44 year old female with history of vitamin D deficiency. She presents with leg pain since _0 [address], [place], [place] [address]           Lindon Romp, MD  [phone] Result(s) reported on 15 May 2013 at 12:48PM.)  CA 27.29 ========== TEST NAME ==========  ========= RESULTS =========  = REFERENCE RANGE =  CA 27.29   Carbohydrate Antigen 19-9 1 ([facility] methodology            [facility]            No: [id number]           [address], [place], [place] [address]           Lindon Romp, MD         [phone] Result(s) reported on 15 May 2013 at 12:48PM.)  General Ref:  05-May-15 16:42   Factor 2 Mutation Analysis ========== TEST NAME ==========  ========= RESULTS =========  = REFERENCE RANGE =  FACTOR 2 MUTATION ANALYS  Factor II, DNA Analysis Factor II, DNA Analysis         [   Final Report         ]                   NEGATIVE No mutation identified.                                                                      . Comment: A point mutation ([id number]) in the factor II (prothrombin) gene is the second most common cause of inherited thrombophilia. The incidence of this mutation in the [place] Caucasian population is about 2% and in the [place] American population it is approximately 0.5%. This mutation is rare in the [place] and Native American population. Being heterozygous for a prothrombin mutation increases the risk for developing venous thrombosis about 2 to 3 times above the general population risk. Being homozygous for the prothrombin gene mutation  increases the relative risk for venous thrombosis further, although it is not yet known how much further the risk is increased.In women heterozygous for the prothrombin gene mutation, the use of estrogen containing oral contraceptives increases the relative risk of venous thrombosis about 16 times and the risk of developing cerebral thrombosis is also significantly increased. In pregnancy the prothrombin gene mutation increases risk for venous thrombosis and may increase risk for stillbirth, placental abruption, pre-eclampsia and fetal growth restriction. If the patient possesses two or more congenital or acquired thrombophilic risk factors, the risk for thrombosis may rise to more than the sum of the risk ratios for the individual mutations. This assay detects only the prothrombin G20210A mutation and does not measure genetic abnormalities elsewhere in the genome. Other thrombotic risk factors may be pursued through systematic clinical laboratory analysis. These factors include the R506Q ([place]) mutation in the Factor V gene, plasma homocysteine levels, as well as testing for deficiencies of antithrombin III, protein C and protein S. Additional Information:         [   Final Report         ]                   Genetic Counselors are available for health care providers to discuss results at [phone]).                                Marland Kitchen  Methodology: DNA analysis of the Factor II gene was performed by PCR amplification followed by restriction analysis. The diagnostic sensitivity is >99% for both. All the tests must be combined with clinical information for the most accurate interpretation. Molecular-based testing is highly accurate, but as in any laboratory test, diagnostic errors may occur.                                                           Marland Kitchen Poort SR, et al. Blood. 1996; 84:1324-4010. Varga EA. Circulation. 2004; 272:Z36-U44. Mervin Hack, et [place]; 19:700-703.                                                           Allison Quarry, PhD Berenice Primas, PhD Jens Som, PhD Broadus John, PhD Alfredo Bach, PhD Norva Riffle, PhD                                                           .               [facility]                   No: [id number]           [address], [place], [place] [address]           Nechama Guard, MD      [phone]   Result(s) reported on 15 May 2013 at 03:48PM.  Protein C and S Panel ========== TEST NAME ==========  ========= RESULTS =========  = REFERENCE RANGE =  PROTEIN C AND S PANEL  PrtCAg+PrtSAg Protein C Antigen               [   94 %                 ]            70-140 Protein S, Total                [   109 %         ]            58-150 Protein S, Free                 [   95 %                 ]            [address]               [facility]            No: [id number]           [address] [place], [place] [address]           Lindon Romp, MD         [phone]   Result(s)  reported on 14 May 2013 at 07:49AM.  Beta 2 Glycoprotein ========== TEST NAME ==========  ========= RESULTS =========  = REFERENCE RANGE =  BETA2 GLYCOPROTEIN  Beta-2 Glycoprotein I Ab,G,A,M Beta-2 Glycoprotein I Ab, IgG   [   <9 GPI IgG units     ]              0-20 The reference interval reflects a3SD or 99th percentile interval, which is thought to represent a potentially clinically significant result in accordance with the International Consensus Statement on the classification criteria for definitive antiphospholipid syndrome (APS). J Thromb Haem 2006;4:295-306. Beta-2 Glycoprotein I Ab, IgA   [   <9 GPI IgA units     ]              0-25 The reference interval reflects a 3SD or 99th percentile interval, which is thought to represent a potentially clinically significant result inaccordance with the International Consensus Statement on the  classification criteria for definitive antiphospholipid syndrome (APS). J Thromb Haem 2006;4:295-306. Beta-2 Glycoprotein I Ab, IgM   [   <9 GPI IgM units     ]              0-32 The reference interval reflects a 3SD or 99th percentile interval, which is thought to represent a potentially clinically significant result in accordance with the International Consensus Statement on the classification criteria for definitive antiphospholipid syndrome (APS). J Thromb Haem 2006;4:295-306.               [facility]            No: [id number]           [address], [place], [place] [address]           Lindon Romp, MD         [phone]   Result(s) reported on 13 May 2013 at 05:48PM.  Anticardiolipin ABS IgG IgM ========== TEST NAME ==========  ========= RESULTS =========  = REFERENCE RANGE =  ANTICARDIOLPN AB IGG,IGM  Anticardiolipin Ab, IgG/M, Qn Anticardiolipin Ab,IgG,Qn       [   <9 GPL U/mL          ]              0-14             Negative:              <15                                          Indeterminate:     15 - 20                                          Low-Med Positive: >20 - 80                                          High Positive:         >80 Anticardiolipin Ab,IgM,Qn       [   <9 MPL U/mL          ]  0-12                                          Negative:              <13                                          Indeterminate:     13 - 20   Low-Med Positive: >20 - 80                                          High Positive:         >80               [facility]            No: [id number]           [address], [place], [place] [address]           Lindon Romp, MD     [phone]   Result(s) reported on 13 May 2013 at 05:48PM.  06-May-15 04:38   Prolactin, Serum ========== TEST NAME ==========  ========= RESULTS =========  = REFERENCE RANGE =  PROLACTIN  Prolactin Prolactin                       [   12.9 ng/mL            ]          4.8-23.3               [facility]            No: [id number]          [address], [place], [place] [address]           Lindon Romp, MD         [phone]   Result(s) reported on 14 May 2013 at 04:19AM.    20:42   ANA w/ Reflex to 5 Conf.Tests ========== TEST NAME ==========  ========= RESULTS =========  = REFERENCE RANGE =  ANA W/REFLEX-5 CONF.TEST  ANA w/Reflex ANA Direct                      [   Negative             ]          Negative               [id number]           [address] [place], [place] [address]           Lindon Romp, MD         [phone]   Result(s) reported on 15 May 2013 at 12:49PM.  Protein Immunoelectrophoresis, Serum ========== TEST NAME ==========  ========= RESULTS =========  = REFERENCE RANGE =  PROT IMMUNOELECTROPHORES   CA-125 ========== TEST NAME ==========  ========= RESULTS =========  = REFERENCE RANGE =  CANCER ANTIGEN 125   Cardiology:  06-May-15 11:56   Echo Doppler REASON FOR EXAM:     COMMENTS:  PROCEDURE: [facility] - ECHO DOPPLER COMPLETE(TRANSTHOR)  - May 13 2013 11:56AM   RESULT: Echocardiogram Report  Patient Name:   Brooke Page Date of Exam: 05/13/2013 Medical Rec #:  [id number]               Custom1: Date of Birth:  02/17/1970            Height:       65.0 in Patient Age:    42 years             Weight:       220.0 lb Patient Gender: F                    BSA:          2.06 m??  Indications: Pulmonary embolism. Sonographer:    Sherrie Sport RDCS Referring Phys: Loletha Grayer, Lenna Sciara  Sonographer Comments: [facility]  Summary:  1. Left ventricular ejection fraction, by visual estimation, is 55 to  60%.  2. Normal global left ventricular systolic function.  3. Moderate concentric left ventricular hypertrophy.  4. Moderately increased left ventricular septal thickness.  5. Mildly dilated left atrium.  6. Mild mitral valve regurgitation.  7. Moderately increased  left ventricular posterior wall thickness. 2D AND M-MODE MEASUREMENTS (normal ranges within parentheses): Left Ventricle:          Normal IVSd (2D):      1.19 cm (0.7-1.1) LVPWd (2D):     1.16 cm (0.7-1.1) Aorta/LA:                  Normal LVIDd (2D):     4.20 cm (3.4-5.7) Aortic Root (2D): 3.10 cm (2.4-3.7) LVIDs (2D):     3.01 cm           Left Atrium (2D): 4.80 cm (1.9-4.0) LV FS (2D):     28.3 %   (>25%) LV EF (2D):     55.1 %   (>50%)                                   Right Ventricle:                                   RVd (2D):        0.45 cm LV DIASTOLIC FUNCTION: MV Peak E: 1.01 m/s E/e' Ratio: 11.00 MV Peak A: 0.76 m/s Decel Time: 215 msec E/A Ratio: 1.32 SPECTRAL DOPPLER ANALYSIS (where applicable): Mitral Valve: MV P1/2 Time: 62.35 msec MV Area, PHT: 3.53 cm?? Aortic Valve: AoV Max Vel: 1.50 m/s AoV Peak PG: 9.1 mmHg AoV Mean PG: LVOT Vmax: 1.10 m/s LVOT VTI:  LVOT Diameter: 2.00 cm AoV Area, Vmax: 2.30 cm?? AoV Area, VTI:  AoV Area, Vmn: Tricuspid Valve and PA/RV Systolic Pressure: TR Max Velocity: 2.18 m/s RA  Pressure: 5 mmHg RVSP/PASP: 23.9 mmHg Pulmonic Valve: PV Max Velocity: 1.15 m/s PV Max PG: 5.3 mmHg PV Mean PG:  PHYSICIAN INTERPRETATION: Left Ventricle: The left ventricular internal cavity size was normal. LV  septal wall thickness was moderately increased. LVposterior wall  thickness was moderately increased. Moderate concentric left ventricular  hypertrophy. Global LV systolic function was normal. Left ventricular  ejection fraction, by visual estimation, is 55 to 60%. Right Ventricle: The right ventricular size is normal. Left Atrium: The left atrium is mildly dilated.  Mitral Valve: The mitral valve is not well seen. Mild mitral valve  regurgitation is seen. Tricuspid Valve: The tricuspid valve is not well seen. Trivial tricuspid  regurgitation is visualized. The tricuspid regurgitant velocity is 2.18  m/s, and with an assumed right atrial pressure of 5  mmHg, the estimated  right ventricular systolic pressure is normal at 23.9 mmHg. Aortic Valve: The aortic valve is tricuspid. The aortic valve is  structurally normal, with no evidence of sclerosis or stenosis. No  evidence of aortic valve regurgitation is seen.  Jordan MD Electronically signed by [id number] Bill MD Signature Date/Time: 05/13/2013/12:53:16 PM *** Final ***  IMPRESSION: .    Verified By: Teodoro Spray, M.D., MD  Routine Chem:  05-May-15 12:54   Result Comment APTT - RESULTS VERIFIED BY REPEAT TESTING.  Result(s) reported on 12 May 2013 at 05:50PM.  Glucose, Serum 88  BUN 8  Creatinine (comp) 0.66  Sodium, Serum 136  Potassium, Serum  3.2  Chloride, Serum 103  CO2, Serum 27  Calcium (Total), Serum 8.6  Anion Gap  6  Osmolality (calc) 270  eGFR (African American) >60  eGFR (Non-African American) >60 (eGFR values <28mL/min/1.73 m2 may be an indication of chronic kidney disease (CKD). Calculated eGFR is useful in patients with stable renal function. The eGFR calculation will not be reliable in acutely ill patients when serum creatinine is changing rapidly. It is not useful in  patients on dialysis. The eGFR calculation may not be applicable to patients at the low and high extremes of body sizes, pregnant women, and vegetarians.)  B-Type Natriuretic Peptide [facility])  144 (Result(s) reported on 12 May 2013 at 01:32PM.)  06-May-15 04:38   Potassium, Serum  3.1  Cardiac:  05-May-15 12:54   Troponin I < 0.02 (0.00-0.05 0.05 ng/mL or less: NEGATIVE  Repeat testing in 3-6 hrs  if clinically indicated. >0.05 ng/mL: POTENTIAL  MYOCARDIAL INJURY. Repeat  testing in 3-6 hrs if  clinically indicated. NOTE: An increase or decrease  of 30% or more on serial  testing suggests a  clinically important change)  Routine Coag:  05-May-15 12:54   Activated PTT (APTT)  < 23.0 (A HCT value >55% may artifactually increase the APTT. In one study, the increase  was an average of 19%. Reference: "Effect on Routine and Special Coagulation Testing Values of Citrate Anticoagulant Adjustment in Patients with High HCT Values." American Journal of Clinical Pathology 2006;126:400-405.)  Prothrombin 12.8  INR 1.0 (INR reference interval applies to patients on anticoagulant therapy. A single INR therapeutic range for coumarins is not optimal for all indications; however, the suggested range for most indications is 2.0 - 3.0. Exceptions to the INR Reference Range may include: Prosthetic heart valves, acute myocardial infarction, prevention of myocardial infarction, and combinations of aspirin and anticoagulant. The need for a higher or lower target INR must be assessed individually. Reference: The Pharmacology and Management of the Vitamin K  antagonists: the seventh ACCP Conference on Antithrombotic and Thrombolytic Therapy. KGMWN.0272 Sept:126 (3suppl): [id number]. A HCT value >55% may artifactually increase the PT.  In one study,  the increase was an average of 25%. Reference:  "Effect on Routine and Special Coagulation Testing Values of Citrate Anticoagulant Adjustment in Patients with High HCT Values." American Journal of Clinical Pathology 2006;126:400-405.)  Routine Hem:  05-May-15 12:54   WBC (CBC) 8.9  RBC (CBC)  3.67  Hemoglobin (CBC)  5.9  Hematocrit (CBC)  20.8  Platelet Count (CBC) 290 (Result(s) reported on  12 May 2013 at 01:22PM.)  MCV  57  MCH  15.9  MCHC  28.1  RDW  19.0    23:02   Hemoglobin (CBC)  6.2 (Result(s) reported on 12 May 2013 at 11:22PM.)  06-May-15 04:38   Hemoglobin (CBC) -    07:44   Hemoglobin (CBC)  7.9   PERTINENT RADIOLOGY STUDIES: [place]:    05-May-15 11:08, [place] Color Flow Doppler Low Extrem Left (Leg)  [place] Color Flow Doppler Low Extrem Left (Leg)   REASON FOR EXAM:    Swelling  COMMENTS:   LMP: Three weeks ago    PROCEDURE: [place]  - US DOPPLER LOW EXTR LEFT  - May 12 2013 11:08AM     CLINICAL DATA:  Pain,  swelling, color changes/ulcerations.    EXAM:  LEFT LOWER EXTREMITY VENOUS DOPPLER ULTRASOUND    TECHNIQUE:  Gray-scale sonography with compression, as well as color and duplex  ultrasound, were performed to evaluate the deep venous system from  the level of the common femoral vein through the popliteal and  proximal calf veins.  COMPARISON:None    FINDINGS:  The distal femoral vein is incompletely compressible, with thrombus  extending into the proximal popliteal vein. Limited flow signal  through the segments on color interrogation. Normal compressibility  of the common femoral vein as well as the proximal calf veins.  Doppler waveforms show normal direction of venous flow, normal  respiratory phasicity. Augmentation was not performed.     IMPRESSION:  1. Occlusive popliteal and distal femoral vein DVT.      Electronically Signed    By: Arne Cleveland M.D.    On: 05/12/2013 12:03         Verified By: Kandis Cocking, M.D.,  LabUnknown:    05-May-15 14:18, CT [facility] Chest with for PE  PACS Image    Pertinent Past History:  Pertinent Past History Vitamin D deficiency, anemia in the past with pregnancy but none since.   Hospital Course:  Hospital Course * Symptomatic anemia with pica ice eating.    Have heavy menstruation- Appreciated Gyn consult.   Received Blood transfusion by ER.  will give Oral iron on d/c.  * Deep vein thrombosis left lower extremity with severe pain and pulmonary embolism. IV heparin    s/p IVC filter.    Called Hematology consult to decide best anticoagulat in her case.   suggested xarelto for 6 mth- lab work up sent.  *menorrhagia- as per GYN- likely anovular cycles.    will do IUD in clinic. *  Hypokalemia. replace potassium orally.  * Allergic rhinitis. Continue Claritin.  * Vitamin D deficiency. Continue vitamin D supplementation.  * Mother had Cancerous polyp in colon at age 93Yr- called GI consult for anemia + this family  hx.    suggest upper and lower GI scope in 2-3 weeks after office visit.   Condition on Discharge Stable   Code Status:  Code Status Full Code   DISCHARGE INSTRUCTIONS HOME MEDS:  Medication Reconciliation: Patient's Home Medications at Discharge:     Medication Instructions  vitamin d3 2000 intl units oral tablet  1 tab(s) orally once a day   loratadine 10 mg oral tablet  1 tab(s) orally once a day, As Needed   oxycodone 5 mg oral tablet  1 tab(s) orally 1 to 4 times a day x 4 days, As Needed, pain , As needed, pain   rivaroxaban 15 mg oral tablet  1 tab(s) orally 2 times  a day (with meals) x 21 days   rivaroxaban 20 mg oral tablet  1 tab(s) orally once a day-s tart after 21 days ( finishing 15 mg BID dose over)   ferrous sulfate 325 mg (65 mg elemental iron) oral tablet  1 tab(s) orally 3 times a day (with meals)    STOP TAKING THE FOLLOWING MEDICATION(S):    aspirin 81 mg oral tablet, chewable: 1 tab(s) orally once a day  Physician's Instructions:  Diet Regular   Activity Limitations As tolerated   Return to Work Not Applicable   Time frame for Follow Up Appointment 1-2 weeks  Dr, Inez Pilgrim   Time frame for Follow Up Appointment 2-4 weeks  Dr, Donnella Sham   Time frame for Follow Up Appointment 1-2 weeks  Gyn clinic.   Other Comments follow as above.   Electronic Signatures: Vaughan Basta (MD)  (Signed [phone] 23:44)  Authored: ADMISSION DATE AND DIAGNOSIS, CHIEF COMPLAINT/HPI, Allergies, PERTINENT LABS, PERTINENT RADIOLOGY STUDIES, PERTINENT PAST HISTORY, HOSPITAL COURSE, DISCHARGE INSTRUCTIONS HOME MEDS, PATIENT INSTRUCTIONS   Last Updated: 10-May-15 23:44 by Vaughan Basta (MD)

## 2014-05-03 ENCOUNTER — Other Ambulatory Visit: Payer: Self-pay | Admitting: Vascular Surgery

## 2014-05-14 ENCOUNTER — Encounter
Admission: RE | Disposition: A | Discharge status: Home / Self Care | Payer: 59 | Source: Ambulatory Visit | Attending: Vascular Surgery

## 2014-05-14 ENCOUNTER — Ambulatory Visit
Admission: RE | Admit: 2014-05-14 | Discharge: 2014-05-14 | Disposition: A | Discharge status: Home / Self Care | Payer: 59 | Source: Ambulatory Visit | Attending: Vascular Surgery | Admitting: Vascular Surgery

## 2014-05-14 ENCOUNTER — Encounter: Payer: Self-pay | Admitting: *Deleted

## 2014-05-14 DIAGNOSIS — Z452 Encounter for adjustment and management of vascular access device: Secondary | ICD-10-CM | POA: Diagnosis present

## 2014-05-14 DIAGNOSIS — Z79899 Other long term (current) drug therapy: Secondary | ICD-10-CM | POA: Insufficient documentation

## 2014-05-14 DIAGNOSIS — I10 Essential (primary) hypertension: Secondary | ICD-10-CM | POA: Insufficient documentation

## 2014-05-14 DIAGNOSIS — Z86718 Personal history of other venous thrombosis and embolism: Secondary | ICD-10-CM | POA: Diagnosis not present

## 2014-05-14 HISTORY — DX: History of uterine scar from previous surgery: Z98.891

## 2014-05-14 HISTORY — DX: Acute embolism and thrombosis of unspecified deep veins of unspecified lower extremity: I82.409

## 2014-05-14 HISTORY — DX: Personal history of other diseases of the digestive system: Z87.19

## 2014-05-14 HISTORY — DX: Essential (primary) hypertension: I10

## 2014-05-14 HISTORY — PX: VENOGRAM: SHX5497

## 2014-05-14 HISTORY — DX: Other pulmonary embolism without acute cor pulmonale: I26.99

## 2014-05-14 HISTORY — PX: PERIPHERAL VASCULAR CATHETERIZATION: SHX172C

## 2014-05-14 LAB — CREATININE, SERUM
Creatinine, Ser: 0.65 mg/dL (ref 0.44–1.00)
GFR calc Af Amer: >60 mL/min (ref >60)
GFR calc non Af Amer: >60 mL/min (ref >60)

## 2014-05-14 LAB — BUN: BUN: 15 mg/dL (ref 6–20)

## 2014-05-14 SURGERY — IVC FILTER REMOVAL
Anesthesia: Moderate Sedation

## 2014-05-14 MED ORDER — SODIUM CHLORIDE 0.9 % IV SOLN
INTRAVENOUS | Status: DC
  Administered 2014-05-14: 08:00:00 via INTRAVENOUS

## 2014-05-14 MED ORDER — FENTANYL CITRATE (PF) 100 MCG/2ML IJ SOLN
INTRAMUSCULAR | Status: AC
  Filled 2014-05-14: qty 2

## 2014-05-14 MED ORDER — CLINDAMYCIN PHOSPHATE 300 MG/50ML IV SOLN
300.0000 mg | Freq: Once | INTRAVENOUS | Status: AC
  Administered 2014-05-14: 300 mg via INTRAVENOUS
  Filled 2014-05-14: qty 50

## 2014-05-14 MED ORDER — LIDOCAINE-EPINEPHRINE (PF) 1 %-1:200000 IJ SOLN
INTRAMUSCULAR | Status: AC
  Filled 2014-05-14: qty 30

## 2014-05-14 MED ORDER — LIDOCAINE-EPINEPHRINE (PF) 1 %-1:200000 IJ SOLN
INTRAMUSCULAR | Status: DC | PRN
  Administered 2014-05-14: 10 mL

## 2014-05-14 MED ORDER — MIDAZOLAM HCL 10 MG/2ML IJ SOLN
2.0000 mg | Freq: Once | INTRAMUSCULAR | Status: AC
  Administered 2014-05-14: 2 mg via INTRAVENOUS

## 2014-05-14 MED ORDER — MIDAZOLAM HCL 5 MG/5ML IJ SOLN
INTRAMUSCULAR | Status: AC
  Filled 2014-05-14: qty 5

## 2014-05-14 MED ORDER — HYDROMORPHONE HCL 1 MG/ML IJ SOLN: 1.0000 mg | Freq: Once | INTRAMUSCULAR | Status: DC | PRN

## 2014-05-14 MED ORDER — IOPAMIDOL (ISOVUE-300) INJECTION 61%
INTRAVENOUS | Status: DC | PRN
Start: 2014-05-14 — End: 2014-05-14
  Administered 2014-05-14: 15 mL via INTRAVENOUS

## 2014-05-14 MED ORDER — MIDAZOLAM HCL 2 MG/2ML IJ SOLN
INTRAMUSCULAR | Status: DC | PRN
  Administered 2014-05-14 (×3): 1 mg via INTRAVENOUS

## 2014-05-14 MED ORDER — CLINDAMYCIN PHOSPHATE 300 MG/50ML IV SOLN
INTRAVENOUS | Status: AC
  Administered 2014-05-14: 300 mg via INTRAVENOUS
  Filled 2014-05-14: qty 50

## 2014-05-14 MED ORDER — ONDANSETRON HCL 4 MG/2ML IJ SOLN: 4.0000 mg | INTRAMUSCULAR | Status: DC | PRN

## 2014-05-14 MED ORDER — FENTANYL CITRATE (PF) 100 MCG/2ML IJ SOLN
INTRAMUSCULAR | Status: DC | PRN
  Administered 2014-05-14 (×2): 50 ug via INTRAVENOUS

## 2014-05-14 MED ORDER — FENTANYL CITRATE (PF) 100 MCG/2ML IJ SOLN
50.0000 ug | Freq: Once | INTRAMUSCULAR | Status: AC
  Administered 2014-05-14: 50 ug via INTRAVENOUS

## 2014-05-14 MED ORDER — HEPARIN (PORCINE) IN NACL 2-0.9 UNIT/ML-% IJ SOLN
INTRAMUSCULAR | Status: AC
  Filled 2014-05-14: qty 500

## 2014-05-14 SURGICAL SUPPLY — 4 items
PACK ANGIOGRAPHY (CUSTOM PROCEDURE TRAY) ×3 IMPLANT
SET VENACAVA FILTER RETRIEVAL (MISCELLANEOUS) ×3 IMPLANT
TOWEL OR 17X26 4PK STRL BLUE (TOWEL DISPOSABLE) ×3 IMPLANT
WIRE G 030X145 068400025406 (WIRE) ×3 IMPLANT

## 2014-05-14 NOTE — Op Note (Signed)
  OPERATIVE NOTE   PROCEDURE: 1. Removal of inferior vena cava filter 2. Inferior venacavogram  PRE-OPERATIVE DIAGNOSIS: History of DVT  POST-OPERATIVE DIAGNOSIS: History of DVT  SURGEON: Katha Cabal, M.D. ASSISTANT(S): None  ANESTHESIA: IV sedation  ESTIMATED BLOOD LOSS: Minimal cc  FINDING(S): 1.  Widely patent IVC thrombus within the filter  SPECIMEN(S):  IVC filter intact  INDICATIONS:   Brooke Page is a 44 y.o. female who presents with history of DVT status post IVC filter placement.  DESCRIPTION: After obtaining full informed written consent, the patient was brought back to the operating room and placed supine upon the operating table.  The patient received IV antibiotics prior to induction.  After obtaining adequate anesthesia, the patient was prepped and draped in the standard fashion appropriate time out is called.    The right neck was then imaged with ultrasound. Ultrasound was placed in a sterile sleeve. Jugular vein was identified it is echolucent and homogeneous indicating patency. 1% lidocaine is then infiltrated under ultrasound visualization and subsequently a Seldinger needle is inserted under real-time ultrasound guidance.  J-wire is then advanced into the inferior vena cava under fluoroscopic guidance. With the tip of the sheath at the confluence of the iliac veins inferior vena caval imaging is performed.  After review of the image the sheath is repositioned to above the filter and the snares introduced. Snares opened and the hook is secured without difficulty. The filter is then collapsed within the sheath and removed without difficulty.  Sheath is removed by pressures held the patient tolerated the procedure well and there were no immediate complications.  Interpretation inferior vena cava is widely patent filter is in place in good position. Filter is removed without incident.   COMPLICATIONS: None  CONDITION: Brooke Page,  M.D. [facility] Office: [phone]   05/14/2014, 9:11 AM

## 2014-05-14 NOTE — Discharge Instructions (Signed)

## 2014-05-19 ENCOUNTER — Encounter: Payer: Self-pay | Admitting: Vascular Surgery

## 2014-06-29 ENCOUNTER — Other Ambulatory Visit: Payer: Self-pay | Admitting: Family Medicine

## 2014-06-29 NOTE — Telephone Encounter (Signed)
Patient requesting refill. 

## 2014-07-01 ENCOUNTER — Other Ambulatory Visit: Payer: Self-pay

## 2014-07-01 MED ORDER — TELMISARTAN-HCTZ 80-25 MG PO TABS: 1.0000 | ORAL_TABLET | Freq: Every day | ORAL | Status: DC

## 2014-07-02 ENCOUNTER — Other Ambulatory Visit: Payer: Self-pay

## 2014-07-02 MED ORDER — TELMISARTAN-HCTZ 80-25 MG PO TABS: 1.0000 | ORAL_TABLET | Freq: Every day | ORAL | Status: DC

## 2014-07-11 ENCOUNTER — Encounter: Payer: Self-pay | Admitting: Family Medicine

## 2014-07-11 DIAGNOSIS — G4700 Insomnia, unspecified: Secondary | ICD-10-CM | POA: Insufficient documentation

## 2014-07-11 DIAGNOSIS — D251 Intramural leiomyoma of uterus: Secondary | ICD-10-CM | POA: Insufficient documentation

## 2014-07-11 DIAGNOSIS — J302 Other seasonal allergic rhinitis: Secondary | ICD-10-CM | POA: Insufficient documentation

## 2014-07-11 DIAGNOSIS — N920 Excessive and frequent menstruation with regular cycle: Secondary | ICD-10-CM | POA: Insufficient documentation

## 2014-07-11 DIAGNOSIS — F324 Major depressive disorder, single episode, in partial remission: Secondary | ICD-10-CM | POA: Insufficient documentation

## 2014-07-11 DIAGNOSIS — Z6710 Type A blood, Rh positive: Secondary | ICD-10-CM | POA: Insufficient documentation

## 2014-07-11 DIAGNOSIS — Z9289 Personal history of other medical treatment: Secondary | ICD-10-CM | POA: Insufficient documentation

## 2014-07-11 DIAGNOSIS — E559 Vitamin D deficiency, unspecified: Secondary | ICD-10-CM | POA: Insufficient documentation

## 2014-07-11 DIAGNOSIS — Z8759 Personal history of other complications of pregnancy, childbirth and the puerperium: Secondary | ICD-10-CM | POA: Insufficient documentation

## 2014-07-11 DIAGNOSIS — Z95828 Presence of other vascular implants and grafts: Secondary | ICD-10-CM | POA: Insufficient documentation

## 2014-07-11 DIAGNOSIS — I340 Nonrheumatic mitral (valve) insufficiency: Secondary | ICD-10-CM | POA: Insufficient documentation

## 2014-07-11 DIAGNOSIS — Z862 Personal history of diseases of the blood and blood-forming organs and certain disorders involving the immune mechanism: Secondary | ICD-10-CM | POA: Insufficient documentation

## 2014-07-11 DIAGNOSIS — E66811 Obesity, class 1: Secondary | ICD-10-CM | POA: Insufficient documentation

## 2014-07-11 DIAGNOSIS — Z87410 Personal history of cervical dysplasia: Secondary | ICD-10-CM | POA: Insufficient documentation

## 2014-07-11 DIAGNOSIS — I10 Essential (primary) hypertension: Secondary | ICD-10-CM | POA: Insufficient documentation

## 2014-07-11 DIAGNOSIS — E669 Obesity, unspecified: Secondary | ICD-10-CM | POA: Insufficient documentation

## 2014-07-11 DIAGNOSIS — K449 Diaphragmatic hernia without obstruction or gangrene: Secondary | ICD-10-CM | POA: Insufficient documentation

## 2014-07-13 ENCOUNTER — Ambulatory Visit (INDEPENDENT_AMBULATORY_CARE_PROVIDER_SITE_OTHER): Payer: 59 | Admitting: Family Medicine

## 2014-07-13 ENCOUNTER — Encounter: Payer: Self-pay | Admitting: Family Medicine

## 2014-07-13 VITALS — BP 130/84 | HR 109 | Temp 98.6°F | Resp 16 | Ht 60.0 in | Wt 236.8 lb

## 2014-07-13 DIAGNOSIS — F418 Other specified anxiety disorders: Secondary | ICD-10-CM

## 2014-07-13 DIAGNOSIS — Z13220 Encounter for screening for lipoid disorders: Secondary | ICD-10-CM

## 2014-07-13 DIAGNOSIS — Z1239 Encounter for other screening for malignant neoplasm of breast: Secondary | ICD-10-CM

## 2014-07-13 DIAGNOSIS — Z803 Family history of malignant neoplasm of breast: Secondary | ICD-10-CM | POA: Diagnosis not present

## 2014-07-13 DIAGNOSIS — Z800 Family history of malignant neoplasm of digestive organs: Secondary | ICD-10-CM | POA: Diagnosis not present

## 2014-07-13 DIAGNOSIS — Z862 Personal history of diseases of the blood and blood-forming organs and certain disorders involving the immune mechanism: Secondary | ICD-10-CM | POA: Diagnosis not present

## 2014-07-13 DIAGNOSIS — I10 Essential (primary) hypertension: Secondary | ICD-10-CM | POA: Diagnosis not present

## 2014-07-13 DIAGNOSIS — G4700 Insomnia, unspecified: Secondary | ICD-10-CM

## 2014-07-13 DIAGNOSIS — E559 Vitamin D deficiency, unspecified: Secondary | ICD-10-CM

## 2014-07-13 DIAGNOSIS — R5383 Other fatigue: Secondary | ICD-10-CM | POA: Diagnosis not present

## 2014-07-13 MED ORDER — SERTRALINE HCL 100 MG PO TABS
100.0000 mg | ORAL_TABLET | Freq: Every day | ORAL | Status: DC
Start: 2014-07-13 — End: 2014-09-23

## 2014-07-13 MED ORDER — TELMISARTAN-HCTZ 80-25 MG PO TABS: 1.0000 | ORAL_TABLET | Freq: Every day | ORAL | Status: DC

## 2014-07-13 MED ORDER — TEMAZEPAM 30 MG PO CAPS
30.0000 mg | ORAL_CAPSULE | Freq: Every evening | ORAL | Status: DC | PRN
Start: 2014-07-13 — End: 2014-10-14

## 2014-07-13 NOTE — Progress Notes (Signed)
Name: Brooke Page   MRN: [id number]    DOB: 09/21/1970   Date:07/13/2014       Progress Note  Subjective  Chief Complaint  Chief Complaint  Patient presents with  . Hypertension  . Gastrophageal Reflux  . Insomnia    6 hrs. of sleep per night  . Depression    HPI  Insomnia: she is taking Temzepam every night, and is able to sleep at least 6 hours uninterrupted, and able to fall back asleep if she wakes during the night.   Depression/Anxiety: she is doing well, feels like she is in remission, more patient with her children, but she does not feel numb. No side effects of medication  GERD: doing well, off pantoprazole, taking otc medication as needed, at most once or twice month, controlled with life style modifications  Iron deficiency anemia: found in May 2015 when she went to [facility] for DVT and PE, hgb was down to 5.9 and was given 2 units of PRBC. She is still taking iron supplementation, we will recheck labs today, no SOB, no chest pain  Patient Active Problem List   Diagnosis Date Noted  . Mitral regurgitation 07/11/2014  . Hiatal hernia without gangrene and obstruction 07/11/2014  . Seasonal allergic rhinitis 07/11/2014  . History of pre-eclampsia 07/11/2014  . Type a blood, rh positive 07/11/2014  . Intramural leiomyoma of uterus 07/11/2014  . History of cervical dysplasia 07/11/2014  . Depression with anxiety 07/11/2014  . Controlled insomnia 07/11/2014  . Menorrhagia with regular cycle 07/11/2014  . Obesity (BMI 30.0-34.9) 07/11/2014  . History of iron deficiency anemia 07/11/2014  . Vitamin D deficiency 07/11/2014  . Hypertension, benign 07/11/2014  . History of blood transfusion 07/11/2014  . History of pulmonary embolism 05/08/2013  . History of DVT (deep vein thrombosis) 05/08/2013    Past Surgical History  Procedure Laterality Date  . Tonsillectomy    . Abdominal hysterectomy    . Peripheral vascular catheterization N/A 05/14/2014    Procedure: ivc filter  removal;  Surgeon: Katha Cabal, MD;  Location: [place] [facility];  Service: Cardiovascular;  Laterality: N/A;  . Venogram N/A 05/14/2014    Procedure: Venogram;  Surgeon: Katha Cabal, MD;  Location: [place] [facility];  Service: Cardiovascular;  Laterality: N/A;  IVC  . Leep  2000  . Cesarean section    . Endometrial biopsy  05/27/13    no hyperplasia or carcionam, weakly proliferative endometrium,stomal and or glandular breakdown- [facility]    Family History  Problem Relation Age of Onset  . Cancer Mother     Colon and Breast  . Hypertension Mother   . Diabetes Father   . Hypertension Father   . Diabetes Brother   . Pulmonary embolism Brother   . Obesity Brother   . Asthma Son     History   Social History  . Marital Status: Married    Spouse Name: N/A  . Number of Children: N/A  . Years of Education: N/A   Occupational History  . Not on file.   Social History Main Topics  . Smoking status: Never Smoker   . Smokeless tobacco: Never Used  . Alcohol Use: 0.0 oz/week    0 Standard drinks or equivalent per week     Comment: Occasional  . Drug Use: No  . Sexual Activity:    Partners: Male    Birth Control/ Protection: None     Comment: Tubligation   Other Topics Concern  .  Not on file   Social History Narrative     Current outpatient prescriptions:  .  cholecalciferol (VITAMIN D) 1000 UNITS tablet, Take 1,000 Units by mouth daily., Disp: , Rfl:  .  ferrous sulfate 325 (65 FE) MG tablet, Take 1 tablet by mouth daily., Disp: , Rfl:  .  loratadine (CLARITIN) 10 MG tablet, Take 1 tablet by mouth as needed., Disp: , Rfl:  .  sertraline (ZOLOFT) 100 MG tablet, Take 1 tablet (100 mg total) by mouth daily., Disp: 30 tablet, Rfl: 2 .  telmisartan-hydrochlorothiazide (MICARDIS HCT) 80-25 MG per tablet, Take 1 tablet by mouth daily., Disp: 30 tablet, Rfl: 2 .  temazepam (RESTORIL) 30 MG capsule, Take 1 capsule (30 mg total) by mouth at bedtime as needed for  sleep., Disp: 30 capsule, Rfl: 2  Allergies  Allergen Reactions  . Cephalosporins   . Ceftin [Cefuroxime Axetil] Rash     ROS Constitutional: Negative for fever , positive for weight change.  Respiratory: Negative for cough and shortness of breath.   Cardiovascular: Negative for chest pain or palpitations.  Gastrointestinal: Negative for abdominal pain, no bowel changes.  Musculoskeletal: Negative for gait problem or joint swelling.  Skin: Negative for rash.  Neurological: Negative for dizziness or headache.  No other specific complaints in a complete review of systems (except as listed in HPI above).  Objective  Filed Vitals:   07/13/14 1409  BP: 130/84  Pulse: 109  Temp: 98.6 F (37 C)  TempSrc: Oral  Resp: 16  Height: 5' (1.524 m)  Weight: 236 lb 12.8 oz (107.412 kg)  SpO2: 98%    Body mass index is 46.25 kg/(m^2).  Physical Exam  Constitutional: Patient appears well-developed and well-nourished. No distress. Obesity HENT: Head: Normocephalic and atraumatic. Ears: B TMs ok, no erythema or effusion; Nose: Nose normal. Mouth/Throat: Oropharynx is clear and moist. No oropharyngeal exudate.  Eyes: Conjunctivae and EOM are normal. Pupils are equal, round, and reactive to light. No scleral icterus.  Neck: Normal range of motion. Neck supple. No JVD present. No thyromegaly present.  Cardiovascular: Normal rate, regular rhythm and normal heart sounds.  No murmur heard. No BLE edema. Pulmonary/Chest: Effort normal and breath sounds normal. No respiratory distress. Abdominal: Soft. no distension. There is no tenderness. no masses Musculoskeletal: Normal range of motion, no joint effusions. No gross deformities Neurological: he is alert and oriented to person, place, and time. No cranial nerve deficit. Coordination, balance, strength, speech and gait are normal.  Skin: Skin is warm and dry. No rash noted. No erythema.  Psychiatric: Patient has a normal mood and affect.  behavior is normal. Judgment and thought content normal.  Recent Results (from the past 2160 hour(s))  BUN     Status: None   Collection Time: 05/14/14  8:00 AM  Result Value Ref Range   BUN 15 6 - 20 mg/dL  Creatinine, serum     Status: None   Collection Time: 05/14/14  8:00 AM  Result Value Ref Range   Creatinine, Ser 0.65 0.44 - 1.00 mg/dL   GFR calc non Af Amer >60 >60 mL/min   GFR calc Af Amer >60 >60 mL/min    Comment: (NOTE) The eGFR has been calculated using the CKD EPI equation. This calculation has not been validated in all clinical situations. eGFR's persistently <60 mL/min signify possible Chronic Kidney Disease.      PHQ2/9: Depression screen PHQ 2/9 07/13/2014  Decreased Interest 0  Down, Depressed, Hopeless 0  PHQ -  2 Score 0    Fall Risk: Fall Risk  07/13/2014  Falls in the past year? No     Assessment & Plan  1. Hypertension, benign At goal  - telmisartan-hydrochlorothiazide (MICARDIS HCT) 80-25 MG per tablet; Take 1 tablet by mouth daily.  Dispense: 30 tablet; Refill: 2 - Comprehensive Metabolic Panel (CMET)  2. Vitamin D deficiency  - Vitamin D (25 hydroxy)  3. History of iron deficiency anemia  - CBC with Differential - Iron - Iron Binding Cap (TIBC) - Ferritin  4. Controlled insomnia  - temazepam (RESTORIL) 30 MG capsule; Take 1 capsule (30 mg total) by mouth at bedtime as needed for sleep.  Dispense: 30 capsule; Refill: 2  5. Depression with anxiety  - sertraline (ZOLOFT) 100 MG tablet; Take 1 tablet (100 mg total) by mouth daily.  Dispense: 30 tablet; Refill: 2  6. Breast cancer screening  - MM Digital Screening; Future  7. Other fatigue  - B12  8. Lipid screening  - Lipid Profile   9. Family history of colon cancer Mother was 24 when diagnosed, patient is already having colonoscopies, so far normal, she asked about genetic testing, and gave her some information about it today  10. Family history of breast cancer Mother  was 32 when diagnosed.

## 2014-08-05 ENCOUNTER — Other Ambulatory Visit: Payer: Self-pay

## 2014-08-05 DIAGNOSIS — I10 Essential (primary) hypertension: Secondary | ICD-10-CM

## 2014-08-06 MED ORDER — TELMISARTAN-HCTZ 80-25 MG PO TABS: 1.0000 | ORAL_TABLET | Freq: Every day | ORAL | Status: DC

## 2014-08-13 ENCOUNTER — Ambulatory Visit
Admission: RE | Admit: 2014-08-13 | Discharge: 2014-08-13 | Disposition: A | Discharge status: Home / Self Care | Payer: 59 | Source: Ambulatory Visit | Attending: Family Medicine | Admitting: Family Medicine

## 2014-08-13 DIAGNOSIS — Z1231 Encounter for screening mammogram for malignant neoplasm of breast: Secondary | ICD-10-CM | POA: Diagnosis present

## 2014-08-13 DIAGNOSIS — Z1239 Encounter for other screening for malignant neoplasm of breast: Secondary | ICD-10-CM

## 2014-08-13 LAB — CBC WITH DIFFERENTIAL/PLATELET
Basophils Absolute: 0 10*3/uL (ref 0.0–0.2)
Basos: 0 %
EOS (ABSOLUTE): 0.1 10*3/uL (ref 0.0–0.4)
Eos: 1 %
HEMATOCRIT: 26.5 % — AB (ref 34.0–46.6)
Hemoglobin: 8.1 g/dL — ABNORMAL LOW (ref 11.1–15.9)
Immature Grans (Abs): 0 10*3/uL (ref 0.0–0.1)
Immature Granulocytes: 0 %
LYMPHS ABS: 1.2 10*3/uL (ref 0.7–3.1)
LYMPHS: 20 %
MCH: 20.8 pg — AB (ref 26.6–33.0)
MCHC: 30.6 g/dL — ABNORMAL LOW (ref 31.5–35.7)
MCV: 68 fL — ABNORMAL LOW (ref 79–97)
MONOS ABS: 0.4 10*3/uL (ref 0.1–0.9)
Monocytes: 7 %
NEUTROS PCT: 72 %
Neutrophils Absolute: 4.2 10*3/uL (ref 1.4–7.0)
Platelets: 375 10*3/uL (ref 150–379)
RBC: 3.89 x10E6/uL (ref 3.77–5.28)
RDW: 17.8 % — AB (ref 12.3–15.4)
WBC: 5.9 10*3/uL (ref 3.4–10.8)

## 2014-08-14 LAB — COMPREHENSIVE METABOLIC PANEL
A/G RATIO: 1.6 (ref 1.1–2.5)
ALK PHOS: 47 IU/L (ref 39–117)
ALT: 9 IU/L (ref 0–32)
AST: 13 IU/L (ref 0–40)
Albumin: 3.9 g/dL (ref 3.5–5.5)
BUN / CREAT RATIO: 15 (ref 9–23)
BUN: 10 mg/dL (ref 6–24)
Bilirubin Total: <0.2 mg/dL (ref 0.0–1.2)
CHLORIDE: 99 mmol/L (ref 97–108)
CO2: 24 mmol/L (ref 18–29)
CREATININE: 0.65 mg/dL (ref 0.57–1.00)
Calcium: 8.9 mg/dL (ref 8.7–10.2)
GFR calc Af Amer: 127 mL/min/{1.73_m2} (ref >59)
GFR calc non Af Amer: 110 mL/min/{1.73_m2} (ref >59)
GLOBULIN, TOTAL: 2.4 g/dL (ref 1.5–4.5)
GLUCOSE: 87 mg/dL (ref 65–99)
POTASSIUM: 4.1 mmol/L (ref 3.5–5.2)
SODIUM: 139 mmol/L (ref 134–144)
TOTAL PROTEIN: 6.3 g/dL (ref 6.0–8.5)

## 2014-08-14 LAB — LIPID PANEL
CHOL/HDL RATIO: 2.7 ratio (ref 0.0–4.4)
Cholesterol, Total: 166 mg/dL (ref 100–199)
HDL: 62 mg/dL (ref >39)
LDL CALC: 89 mg/dL (ref 0–99)
Triglycerides: 73 mg/dL (ref 0–149)
VLDL CHOLESTEROL CAL: 15 mg/dL (ref 5–40)

## 2014-08-14 LAB — VITAMIN B12: Vitamin B-12: 489 pg/mL (ref 211–946)

## 2014-08-14 LAB — IRON AND TIBC
IRON: 11 ug/dL — AB (ref 27–159)
Iron Saturation: 3 % — CL (ref 15–55)
Total Iron Binding Capacity: 430 ug/dL (ref 250–450)
UIBC: 419 ug/dL (ref 131–425)

## 2014-08-14 LAB — VITAMIN D 25 HYDROXY (VIT D DEFICIENCY, FRACTURES): Vit D, 25-Hydroxy: 34 ng/mL (ref 30.0–100.0)

## 2014-08-14 LAB — FERRITIN: Ferritin: 12 ng/mL — ABNORMAL LOW (ref 15–150)

## 2014-08-16 ENCOUNTER — Telehealth: Payer: Self-pay

## 2014-08-16 NOTE — Telephone Encounter (Signed)
Left vm for patient to return my call. °

## 2014-08-16 NOTE — Progress Notes (Signed)
Patient notified and verbalized understanding to take Iron supplement three times daily.

## 2014-08-16 NOTE — Telephone Encounter (Signed)
-----   Message from Steele Sizer, MD sent at 08/14/2014 10:56 AM EDT ----- Lipid panel is within normal limits Sugar , kidney and liver functions are within normal limits, normal B12 and vitamin D, please also look at the addendum for CBC results.  Please notify patient, thank you

## 2014-08-25 LAB — LIPID PANEL
Cholesterol: 164 mg/dL (ref 0–200)
HDL: 62 mg/dL (ref 35–70)
LDL CALC: 79 mg/dL
TRIGLYCERIDES: 115 mg/dL (ref 40–160)

## 2014-08-25 LAB — BASIC METABOLIC PANEL: GLUCOSE: 96 mg/dL

## 2014-08-25 LAB — HEMOGLOBIN A1C: HEMOGLOBIN A1C: 5.4 % (ref 4.0–6.0)

## 2014-09-23 ENCOUNTER — Other Ambulatory Visit: Payer: Self-pay

## 2014-09-23 DIAGNOSIS — F418 Other specified anxiety disorders: Secondary | ICD-10-CM

## 2014-09-23 MED ORDER — SERTRALINE HCL 100 MG PO TABS: 100.0000 mg | ORAL_TABLET | Freq: Every day | ORAL | Status: DC

## 2014-09-23 NOTE — Telephone Encounter (Signed)
Patient requesting refill. 

## 2014-10-14 ENCOUNTER — Ambulatory Visit (INDEPENDENT_AMBULATORY_CARE_PROVIDER_SITE_OTHER): Payer: 59 | Admitting: Family Medicine

## 2014-10-14 ENCOUNTER — Encounter: Payer: Self-pay | Admitting: Family Medicine

## 2014-10-14 VITALS — BP 128/64 | HR 108 | Temp 98.4°F | Resp 18 | Ht 60.0 in | Wt 230.5 lb

## 2014-10-14 DIAGNOSIS — Z23 Encounter for immunization: Secondary | ICD-10-CM

## 2014-10-14 DIAGNOSIS — G4700 Insomnia, unspecified: Secondary | ICD-10-CM

## 2014-10-14 DIAGNOSIS — Z862 Personal history of diseases of the blood and blood-forming organs and certain disorders involving the immune mechanism: Secondary | ICD-10-CM | POA: Diagnosis not present

## 2014-10-14 DIAGNOSIS — E559 Vitamin D deficiency, unspecified: Secondary | ICD-10-CM

## 2014-10-14 DIAGNOSIS — F324 Major depressive disorder, single episode, in partial remission: Secondary | ICD-10-CM

## 2014-10-14 DIAGNOSIS — J302 Other seasonal allergic rhinitis: Secondary | ICD-10-CM | POA: Diagnosis not present

## 2014-10-14 DIAGNOSIS — I10 Essential (primary) hypertension: Secondary | ICD-10-CM | POA: Diagnosis not present

## 2014-10-14 MED ORDER — SERTRALINE HCL 100 MG PO TABS: 100.0000 mg | ORAL_TABLET | Freq: Every day | ORAL | Status: DC

## 2014-10-14 MED ORDER — TELMISARTAN-HCTZ 80-25 MG PO TABS: 1.0000 | ORAL_TABLET | Freq: Every day | ORAL | Status: DC

## 2014-10-14 MED ORDER — TEMAZEPAM 30 MG PO CAPS: 30.0000 mg | ORAL_CAPSULE | Freq: Every evening | ORAL | Status: DC | PRN

## 2014-10-14 MED ORDER — FLUTICASONE PROPIONATE 50 MCG/ACT NA SUSP: 2.0000 | Freq: Every day | NASAL | Status: AC

## 2014-10-14 NOTE — Progress Notes (Signed)
Name: Brooke Page   MRN: [id number]    DOB: 07/04/1970   Date:10/14/2014       Progress Note  Subjective  Chief Complaint  Chief Complaint  Patient presents with  . Medication Management    3 month F/U  . Hypertension  . Allergic Rhinitis     Little bit of Sneezing  . Depression    Well controlled with medication  . Insomnia    Well controlled with medication sleeps 7-8 hours nightly    HPI  HTN: taking bp medication as prescribed and no side effects. BP is at goal, no chest pain, no palpitation, no SOB  Major Depression Mild in partial remission: still grieving the loss of her brother but coping better, no longer has anhedonia.  Occasional crying. The heavy feeling in her chest has improved.  She is taking Zoloft and is tolerating it well. She does not want to change dose of medication . Denies suicidal thoughts or ideation  Insomnia: taking Temazepam and is helping her fall and stay asleep, no side effects  AR: symptoms re-starting, sneezing, nares feels raw, mild congestion and cough when she first wakes up.   Anemia: history of severe iron deficiency anemia last year, went to [facility] with hgb of 5.9%, seen by Dr. Inez Pilgrim and recevied iron infusion in the past and two blood transfusion. She states she is not feeling as tired now, taking ferrous sulfate 3 times daily, also eats an iron rich diet. Had endometrial ablation at the same time, and only occasionally has a spotting. She had a negative colonoscopy   Patient Active Problem List   Diagnosis Date Noted  . Family history of colon cancer 07/13/2014  . Family history of breast cancer 07/13/2014  . Mitral regurgitation 07/11/2014  . Hiatal hernia without gangrene and obstruction 07/11/2014  . Seasonal allergic rhinitis 07/11/2014  . History of pre-eclampsia 07/11/2014  . Type a blood, rh positive 07/11/2014  . Intramural leiomyoma of uterus 07/11/2014  . History of cervical dysplasia 07/11/2014  . Major depression in  partial remission ([place]) 07/11/2014  . Controlled insomnia 07/11/2014  . Menorrhagia with regular cycle 07/11/2014  . Obesity (BMI 30.0-34.9) 07/11/2014  . History of iron deficiency anemia 07/11/2014  . Vitamin D deficiency 07/11/2014  . Hypertension, benign 07/11/2014  . History of blood transfusion 07/11/2014  . History of pulmonary embolism 05/08/2013  . History of DVT (deep vein thrombosis) 05/08/2013    Past Surgical History  Procedure Laterality Date  . Tonsillectomy    . Abdominal hysterectomy    . Peripheral vascular catheterization N/A 05/14/2014    Procedure: ivc filter removal;  Surgeon: Katha Cabal, MD;  Location: [place] [facility];  Service: Cardiovascular;  Laterality: N/A;  . Venogram N/A 05/14/2014    Procedure: Venogram;  Surgeon: Katha Cabal, MD;  Location: [place] [facility];  Service: Cardiovascular;  Laterality: N/A;  IVC  . Leep  2000  . Cesarean section    . Endometrial biopsy  05/27/13    no hyperplasia or carcionam, weakly proliferative endometrium,stomal and or glandular breakdown- [facility]    Family History  Problem Relation Age of Onset  . Cancer Mother     Colon and Breast  . Hypertension Mother   . Breast cancer Mother 59  . Diabetes Father   . Hypertension Father   . Diabetes Brother   . Pulmonary embolism Brother   . Obesity Brother   . Asthma Son     Social  History   Social History  . Marital Status: Married    Spouse Name: N/A  . Number of Children: N/A  . Years of Education: N/A   Occupational History  . Not on file.   Social History Main Topics  . Smoking status: Never Smoker   . Smokeless tobacco: Never Used  . Alcohol Use: 0.0 oz/week    0 Standard drinks or equivalent per week     Comment: Occasional  . Drug Use: No  . Sexual Activity:    Partners: Male    Birth Control/ Protection: None     Comment: Tubligation   Other Topics Concern  . Not on file   Social History Narrative     Current  outpatient prescriptions:  .  cholecalciferol (VITAMIN D) 1000 UNITS tablet, Take 1,000 Units by mouth daily., Disp: , Rfl:  .  ferrous sulfate 325 (65 FE) MG tablet, Take 1 tablet by mouth daily., Disp: , Rfl:  .  loratadine (CLARITIN) 10 MG tablet, Take 1 tablet by mouth as needed., Disp: , Rfl:  .  sertraline (ZOLOFT) 100 MG tablet, Take 1 tablet (100 mg total) by mouth daily., Disp: 90 tablet, Rfl: 1 .  telmisartan-hydrochlorothiazide (MICARDIS HCT) 80-25 MG tablet, Take 1 tablet by mouth daily., Disp: 90 tablet, Rfl: 1 .  temazepam (RESTORIL) 30 MG capsule, Take 1 capsule (30 mg total) by mouth at bedtime as needed for sleep., Disp: 90 capsule, Rfl: 1  Allergies  Allergen Reactions  . Cephalosporins   . Ceftin [Cefuroxime Axetil] Rash     ROS  Constitutional: Negative for fever or weight change.  Respiratory: mild am cough no  shortness of breath.   Cardiovascular: Negative for chest pain or palpitations.  Gastrointestinal: Negative for abdominal pain, no bowel changes.  Musculoskeletal: Negative for gait problem or joint swelling.  Skin: Negative for rash.  Neurological: Negative for dizziness or headache.  No other specific complaints in a complete review of systems (except as listed in HPI above).   Objective  Filed Vitals:   10/14/14 1425  BP: 128/64  Pulse: 108  Temp: 98.4 F (36.9 C)  TempSrc: Oral  Resp: 18  Height: 5' (1.524 m)  Weight: 230 lb 8 oz (104.554 kg)  SpO2: 98%    Body mass index is 45.02 kg/(m^2).  Physical Exam  Constitutional: Patient appears well-developed and well-nourished. Obese No distress.  HEENT: head atraumatic, normocephalic, pupils equal and reactive to light,neck supple, throat within normal limits Cardiovascular: Normal rate, regular rhythm and normal heart sounds.  No murmur heard. No BLE edema. Pulmonary/Chest: Effort normal and breath sounds normal. No respiratory distress. Abdominal: Soft.  There is no  tenderness. Psychiatric: Patient has a normal mood and affect. behavior is normal. Judgment and thought content normal.  Recent Results (from the past 2160 hour(s))  Lipid Profile     Status: None   Collection Time: 08/13/14  9:53 AM  Result Value Ref Range   Cholesterol, Total 166 100 - 199 mg/dL   Triglycerides 73 0 - 149 mg/dL   HDL 62 >39 mg/dL    Comment: According to ATP-III Guidelines, HDL-C >59 mg/dL is considered a negative risk factor for CHD.    VLDL Cholesterol Cal 15 5 - 40 mg/dL   LDL Calculated 89 0 - 99 mg/dL   Chol/HDL Ratio 2.7 0.0 - 4.4 ratio units    Comment:  T. Chol/HDL Ratio                                             Men  Women                               1/2 Avg.Risk  3.4    3.3                                   Avg.Risk  5.0    4.4                                2X Avg.Risk  9.6    7.1                                3X Avg.Risk 23.4   11.0   Comprehensive Metabolic Panel (CMET)     Status: None   Collection Time: 08/13/14  9:53 AM  Result Value Ref Range   Glucose 87 65 - 99 mg/dL   BUN 10 6 - 24 mg/dL   Creatinine, Ser 0.65 0.57 - 1.00 mg/dL   GFR calc non Af Amer 110 >59 mL/min/1.73   GFR calc Af Amer 127 >59 mL/min/1.73   BUN/Creatinine Ratio 15 9 - 23   Sodium 139 134 - 144 mmol/L   Potassium 4.1 3.5 - 5.2 mmol/L   Chloride 99 97 - 108 mmol/L   CO2 24 18 - 29 mmol/L   Calcium 8.9 8.7 - 10.2 mg/dL   Total Protein 6.3 6.0 - 8.5 g/dL   Albumin 3.9 3.5 - 5.5 g/dL   Globulin, Total 2.4 1.5 - 4.5 g/dL   Albumin/Globulin Ratio 1.6 1.1 - 2.5   Bilirubin Total <0.2 0.0 - 1.2 mg/dL   Alkaline Phosphatase 47 39 - 117 IU/L   AST 13 0 - 40 IU/L   ALT 9 0 - 32 IU/L  CBC with Differential     Status: Abnormal   Collection Time: 08/13/14  9:53 AM  Result Value Ref Range   WBC 5.9 3.4 - 10.8 x10E3/uL   RBC 3.89 3.77 - 5.28 x10E6/uL   Hemoglobin 8.1 (L) 11.1 - 15.9 g/dL   Hematocrit 26.5 (L) 34.0 - 46.6 %   MCV 68 (L)  79 - 97 fL   MCH 20.8 (L) 26.6 - 33.0 pg   MCHC 30.6 (L) 31.5 - 35.7 g/dL   RDW 17.8 (H) 12.3 - 15.4 %   Platelets 375 150 - 379 x10E3/uL   Neutrophils 72 %   Lymphs 20 %   Monocytes 7 %   Eos 1 %   Basos 0 %   Neutrophils Absolute 4.2 1.4 - 7.0 x10E3/uL   Lymphocytes Absolute 1.2 0.7 - 3.1 x10E3/uL   Monocytes Absolute 0.4 0.1 - 0.9 x10E3/uL   EOS (ABSOLUTE) 0.1 0.0 - 0.4 x10E3/uL   Basophils Absolute 0.0 0.0 - 0.2 x10E3/uL   Immature Granulocytes 0 %   Immature Grans (Abs) 0.0 0.0 - 0.1 x10E3/uL  B12     Status: None   Collection Time: 08/13/14  9:53 AM  Result Value Ref Range  Vitamin B-12 489 211 - 946 pg/mL  Iron Binding Cap (TIBC)     Status: Abnormal   Collection Time: 08/13/14  9:53 AM  Result Value Ref Range   Total Iron Binding Capacity 430 250 - 450 ug/dL   UIBC 419 131 - 425 ug/dL   Iron 11 (L) 27 - 159 ug/dL   Iron Saturation 3 (LL) 15 - 55 %  Ferritin     Status: Abnormal   Collection Time: 08/13/14  9:53 AM  Result Value Ref Range   Ferritin 12 (L) 15 - 150 ng/mL  Vitamin D (25 hydroxy)     Status: None   Collection Time: 08/13/14  9:53 AM  Result Value Ref Range   Vit D, 25-Hydroxy 34.0 30.0 - 100.0 ng/mL    Comment: Vitamin D deficiency has been defined by the Institute of Medicine and an Endocrine Society practice guideline as a level of serum 25-OH vitamin D less than 20 ng/mL (1,2). The Endocrine Society went on to further define vitamin D insufficiency as a level between 21 and 29 ng/mL (2). 1. IOM (Institute of Medicine). 2010. Dietary reference    intakes for calcium and D. [place]: The    [employer]. 2. Holick MF, Binkley , Bischoff-Ferrari HA, et al.    Evaluation, treatment, and prevention of vitamin D    deficiency: an Endocrine Society clinical practice    guideline. JCEM. 2011 Jul; 96(7):1911-30.   Basic metabolic panel     Status: None   Collection Time: 08/25/14 12:00 AM  Result Value Ref Range   Glucose 96  mg/dL  Lipid panel     Status: None   Collection Time: 08/25/14 12:00 AM  Result Value Ref Range   Triglycerides 115 40 - 160 mg/dL   Cholesterol 164 0 - 200 mg/dL   HDL 62 35 - 70 mg/dL   LDL Cholesterol 79 mg/dL  Hemoglobin A1c     Status: None   Collection Time: 08/25/14 12:00 AM  Result Value Ref Range   Hgb A1c MFr Bld 5.4 4.0 - 6.0 %     PHQ2/9: Depression screen [facility] 2/9 10/14/2014 07/13/2014  Decreased Interest 0 0  Down, Depressed, Hopeless 0 0  PHQ - 2 Score 0 0     Fall Risk: Fall Risk  10/14/2014 07/13/2014  Falls in the past year? Yes No  Number falls in past yr: 1 -  Injury with Fall? No -     Functional Status Survey: Is the patient deaf or have difficulty hearing?: No Does the patient have difficulty seeing, even when wearing glasses/contacts?: Yes (contacts) Does the patient have difficulty concentrating, remembering, or making decisions?: No Does the patient have difficulty walking or climbing stairs?: No Does the patient have difficulty dressing or bathing?: No Does the patient have difficulty doing errands alone such as visiting a doctor's office or shopping?: No   Assessment & Plan   1. Hypertension, benign  - telmisartan-hydrochlorothiazide (MICARDIS HCT) 80-25 MG tablet; Take 1 tablet by mouth daily.  Dispense: 90 tablet; Refill: 1  2. Needs flu shot  - Flu Vaccine QUAD 36+ mos PF IM (Fluarix & Fluzone Quad PF)  3. Controlled insomnia  - temazepam (RESTORIL) 30 MG capsule; Take 1 capsule (30 mg total) by mouth at bedtime as needed for sleep.  Dispense: 90 capsule; Refill: 1  4. Vitamin D deficiency  Normal level in August  5. History of iron deficiency anemia  - CBC with Differential/Platelet -  Ferritin - Iron and TIBC  6. Major depressive disorder with single episode, in partial remission (HCC)  - sertraline (ZOLOFT) 100 MG tablet; Take 1 tablet (100 mg total) by mouth daily.  Dispense: 90 tablet; Refill: 1  7. Seasonal allergic  rhinitis  - fluticasone (FLONASE) 50 MCG/ACT nasal spray; Place 2 sprays into both nostrils daily.  Dispense: 42 g; Refill: 0

## 2014-10-21 ENCOUNTER — Other Ambulatory Visit: Payer: 59

## 2014-10-21 ENCOUNTER — Ambulatory Visit: Payer: 59 | Admitting: Internal Medicine

## 2014-11-02 ENCOUNTER — Ambulatory Visit
Admission: RE | Admit: 2014-11-02 | Discharge: 2014-11-02 | Disposition: A | Discharge status: Home / Self Care | Payer: 59 | Source: Ambulatory Visit | Attending: Family Medicine | Admitting: Family Medicine

## 2014-11-02 ENCOUNTER — Encounter: Payer: Self-pay | Admitting: Family Medicine

## 2014-11-02 ENCOUNTER — Ambulatory Visit (INDEPENDENT_AMBULATORY_CARE_PROVIDER_SITE_OTHER): Payer: 59 | Admitting: Family Medicine

## 2014-11-02 VITALS — BP 136/68 | HR 94 | Temp 98.4°F | Resp 18 | Ht 60.0 in | Wt 238.6 lb

## 2014-11-02 DIAGNOSIS — R05 Cough: Secondary | ICD-10-CM | POA: Insufficient documentation

## 2014-11-02 DIAGNOSIS — R058 Other specified cough: Secondary | ICD-10-CM

## 2014-11-02 MED ORDER — GUAIFENESIN-CODEINE 100-10 MG/5ML PO SYRP: 10.0000 mL | ORAL_SOLUTION | Freq: Four times a day (QID) | ORAL | Status: DC | PRN

## 2014-11-02 MED ORDER — PREDNISONE 10 MG PO TABS: 10.0000 mg | ORAL_TABLET | Freq: Every day | ORAL | Status: DC

## 2014-11-02 MED ORDER — AZITHROMYCIN 250 MG PO TABS: ORAL_TABLET | ORAL | Status: DC

## 2014-11-02 NOTE — Progress Notes (Signed)
Name: Brooke Page   MRN: [id number]    DOB: 1970/04/21   Date:11/02/2014       Progress Note  Subjective  Chief Complaint  Chief Complaint  Patient presents with  . URI    Onset-10/21/14 and getting worst, went to Urgent Care last Monday and was given Decongestion. Sore throat, cough-dark yellow mucus, chest soreness from coughing, chest tightness, and fatigue. Taken Ibuprofen,Cough spray and throat lozenges with no relief.    Cough This is a new problem. Episode onset: 10 days ago. The cough is productive of blood-tinged sputum. Associated symptoms include chest pain (chest pain when she coughs.) and a sore throat. Pertinent negatives include no chills, fever (elevated temp) or shortness of breath. She has tried OTC cough suppressant for the symptoms. Her past medical history is significant for environmental allergies. There is no history of asthma, bronchitis or COPD.   Past Medical History  Diagnosis Date  . Pulmonary emboli ([place])   . History of hiatal hernia   . Hypertension   . DVT (deep venous thrombosis) ([place])   . Previous cesarean section   . Allergy   . Depression   . GERD (gastroesophageal reflux disease)     Past Surgical History  Procedure Laterality Date  . Tonsillectomy    . Abdominal hysterectomy    . Peripheral vascular catheterization N/A 05/14/2014    Procedure: ivc filter removal;  Surgeon: Katha Cabal, MD;  Location: [place] [facility];  Service: Cardiovascular;  Laterality: N/A;  . Venogram N/A 05/14/2014    Procedure: Venogram;  Surgeon: Katha Cabal, MD;  Location: [place] [facility];  Service: Cardiovascular;  Laterality: N/A;  IVC  . Leep  2000  . Cesarean section    . Endometrial biopsy  05/27/13    no hyperplasia or carcionam, weakly proliferative endometrium,stomal and or glandular breakdown- [facility]    Family History  Problem Relation Age of Onset  . Cancer Mother     Colon and Breast  . Hypertension Mother   . Breast cancer  Mother 63  . Diabetes Father   . Hypertension Father   . Diabetes Brother   . Pulmonary embolism Brother   . Obesity Brother   . Asthma Son     Social History   Social History  . Marital Status: Married    Spouse Name: N/A  . Number of Children: N/A  . Years of Education: N/A   Occupational History  . Not on file.   Social History Main Topics  . Smoking status: Never Smoker   . Smokeless tobacco: Never Used  . Alcohol Use: 0.0 oz/week    0 Standard drinks or equivalent per week     Comment: Occasional  . Drug Use: No  . Sexual Activity:    Partners: Male    Birth Control/ Protection: None     Comment: Tubligation   Other Topics Concern  . Not on file   Social History Narrative     Current outpatient prescriptions:  .  cholecalciferol (VITAMIN D) 1000 UNITS tablet, Take 1,000 Units by mouth daily., Disp: , Rfl:  .  ferrous sulfate 325 (65 FE) MG tablet, Take 1 tablet by mouth daily., Disp: , Rfl:  .  fluticasone (FLONASE) 50 MCG/ACT nasal spray, Place 2 sprays into both nostrils daily., Disp: 42 g, Rfl: 0 .  loratadine (CLARITIN) 10 MG tablet, Take 1 tablet by mouth as needed., Disp: , Rfl:  .  sertraline (ZOLOFT) 100 MG tablet,  Take 1 tablet (100 mg total) by mouth daily., Disp: 90 tablet, Rfl: 1 .  telmisartan-hydrochlorothiazide (MICARDIS HCT) 80-25 MG tablet, Take 1 tablet by mouth daily., Disp: 90 tablet, Rfl: 1 .  temazepam (RESTORIL) 30 MG capsule, Take 1 capsule (30 mg total) by mouth at bedtime as needed for sleep., Disp: 90 capsule, Rfl: 1  Allergies  Allergen Reactions  . Cephalosporins   . Ceftin [Cefuroxime Axetil] Rash     Review of Systems  Constitutional: Negative for fever (elevated temp) and chills.  HENT: Positive for sore throat.   Respiratory: Positive for cough. Negative for shortness of breath.   Cardiovascular: Positive for chest pain (chest pain when she coughs.).  Endo/Heme/Allergies: Positive for environmental allergies.     Objective  Filed Vitals:   11/02/14 1512  BP: 136/68  Pulse: 94  Temp: 98.4 F (36.9 C)  TempSrc: Oral  Resp: 18  Height: 5' (1.524 m)  Weight: 238 lb 9.6 oz (108.228 kg)  SpO2: 98%    Physical Exam  Constitutional: She is oriented to person, place, and time and well-developed, well-nourished, and in no distress.  HENT:  Head: Normocephalic and atraumatic.  Nose: Right sinus exhibits no maxillary sinus tenderness and no frontal sinus tenderness. Left sinus exhibits no maxillary sinus tenderness and no frontal sinus tenderness.  Mouth/Throat: No posterior oropharyngeal erythema.  Neck: Normal range of motion. Neck supple.  Cardiovascular: Normal rate and regular rhythm.   Murmur heard. Pulmonary/Chest: Effort normal and breath sounds normal. No respiratory distress. She has no wheezes. She has no rales.  Neurological: She is alert and oriented to person, place, and time.  Nursing note and vitals reviewed.  Assessment & Plan  1. Recurrent productive cough Likely bronchitis. We will start on Z-Pak and prednisone along with codeine for relief of coughing. Rule out pneumonia with chest x-ray. - CBC with Differential - DG Chest 2 View; Future - azithromycin (ZITHROMAX) 250 MG tablet; 2 tabs po x day 1, then 1 tab po q day x 4 days  Dispense: 6 tablet; Refill: 0 - predniSONE (DELTASONE) 10 MG tablet; Take 1 tablet (10 mg total) by mouth daily with breakfast.  Dispense: 15 tablet; Refill: 0 - guaiFENesin-codeine (CHERATUSSIN AC) 100-10 MG/5ML syrup; Take 10 mLs by mouth 4 (four) times daily as needed for cough.  Dispense: 200 mL; Refill: 0   Brooke Page [facility] 11/02/2014 3:40 PM

## 2014-11-03 LAB — CBC WITH DIFFERENTIAL/PLATELET
BASOS: 0 %
Basophils Absolute: 0 10*3/uL (ref 0.0–0.2)
EOS (ABSOLUTE): 0.1 10*3/uL (ref 0.0–0.4)
EOS: 1 %
HEMATOCRIT: 30.7 % — AB (ref 34.0–46.6)
Hemoglobin: 9.6 g/dL — ABNORMAL LOW (ref 11.1–15.9)
Immature Grans (Abs): 0 10*3/uL (ref 0.0–0.1)
Immature Granulocytes: 0 %
LYMPHS ABS: 1.6 10*3/uL (ref 0.7–3.1)
Lymphs: 15 %
MCH: 23.5 pg — ABNORMAL LOW (ref 26.6–33.0)
MCHC: 31.3 g/dL — AB (ref 31.5–35.7)
MCV: 75 fL — AB (ref 79–97)
MONOS ABS: 0.6 10*3/uL (ref 0.1–0.9)
Monocytes: 6 %
Neutrophils Absolute: 7.7 10*3/uL — ABNORMAL HIGH (ref 1.4–7.0)
Neutrophils: 78 %
Platelets: 430 10*3/uL — ABNORMAL HIGH (ref 150–379)
RBC: 4.09 x10E6/uL (ref 3.77–5.28)
RDW: 17.8 % — AB (ref 12.3–15.4)
WBC: 10.1 10*3/uL (ref 3.4–10.8)

## 2014-11-03 LAB — IRON AND TIBC
IRON SATURATION: 8 % — AB (ref 15–55)
Iron: 30 ug/dL (ref 27–159)
Total Iron Binding Capacity: 360 ug/dL (ref 250–450)
UIBC: 330 ug/dL (ref 131–425)

## 2014-11-03 LAB — FERRITIN: Ferritin: 23 ng/mL (ref 15–150)

## 2014-11-14 ENCOUNTER — Other Ambulatory Visit: Payer: Self-pay | Admitting: Family Medicine

## 2014-11-15 NOTE — Telephone Encounter (Signed)
Routed note to Dr. Ancil Boozer for approval

## 2014-12-17 ENCOUNTER — Ambulatory Visit (INDEPENDENT_AMBULATORY_CARE_PROVIDER_SITE_OTHER): Payer: 59 | Admitting: Family Medicine

## 2014-12-17 ENCOUNTER — Encounter: Payer: Self-pay | Admitting: Family Medicine

## 2014-12-17 VITALS — BP 134/82 | HR 95 | Temp 98.7°F | Resp 16 | Ht 60.0 in | Wt 232.0 lb

## 2014-12-17 DIAGNOSIS — Z01419 Encounter for gynecological examination (general) (routine) without abnormal findings: Secondary | ICD-10-CM

## 2014-12-17 DIAGNOSIS — Z124 Encounter for screening for malignant neoplasm of cervix: Secondary | ICD-10-CM | POA: Diagnosis not present

## 2014-12-17 DIAGNOSIS — Z0000 Encounter for general adult medical examination without abnormal findings: Secondary | ICD-10-CM

## 2014-12-17 NOTE — Progress Notes (Signed)
Name: Brooke Page   MRN: [id number]    DOB: 02/27/1970   Date:12/17/2014       Progress Note  Subjective  Chief Complaint  Chief Complaint  Patient presents with  . Annual Exam    HPI  Well Woman exam: she is up to date with mammogram, due for cervical cancer screen. No longer has cycles secondary to endometrial ablation done in 05/2013 for treatment of heavy menstrual bleeding and anemia.  She is sexually active, no vaginal problems, no urinary symptoms.  Patient Active Problem List   Diagnosis Date Noted  . Family history of colon cancer 07/13/2014  . Family history of breast cancer 07/13/2014  . Mitral regurgitation 07/11/2014  . Hiatal hernia without gangrene and obstruction 07/11/2014  . Seasonal allergic rhinitis 07/11/2014  . History of pre-eclampsia 07/11/2014  . Type a blood, rh positive 07/11/2014  . Intramural leiomyoma of uterus 07/11/2014  . History of cervical dysplasia 07/11/2014  . Major depression in partial remission ([place]) 07/11/2014  . Controlled insomnia 07/11/2014  . Menorrhagia with regular cycle 07/11/2014  . Obesity (BMI 30.0-34.9) 07/11/2014  . History of iron deficiency anemia 07/11/2014  . Vitamin D deficiency 07/11/2014  . Hypertension, benign 07/11/2014  . History of blood transfusion 07/11/2014  . History of pulmonary embolism 05/08/2013  . History of DVT (deep vein thrombosis) 05/08/2013    Past Surgical History  Procedure Laterality Date  . Tonsillectomy    . Abdominal hysterectomy    . Peripheral vascular catheterization N/A 05/14/2014    Procedure: ivc filter removal;  Surgeon: Katha Cabal, MD;  Location: [place] [facility];  Service: Cardiovascular;  Laterality: N/A;  . Venogram N/A 05/14/2014    Procedure: Venogram;  Surgeon: Katha Cabal, MD;  Location: [place] [facility];  Service: Cardiovascular;  Laterality: N/A;  IVC  . Leep  2000  . Cesarean section    . Endometrial biopsy  05/27/13    no hyperplasia or  carcionam, weakly proliferative endometrium,stomal and or glandular breakdown- [facility]    Family History  Problem Relation Age of Onset  . Cancer Mother     Colon and Breast  . Hypertension Mother   . Breast cancer Mother 17  . Diabetes Father   . Hypertension Father   . Diabetes Brother   . Pulmonary embolism Brother   . Obesity Brother   . Asthma Son     Social History   Social History  . Marital Status: Married    Spouse Name: N/A  . Number of Children: N/A  . Years of Education: N/A   Occupational History  . Not on file.   Social History Main Topics  . Smoking status: Never Smoker   . Smokeless tobacco: Never Used  . Alcohol Use: 0.0 oz/week    0 Standard drinks or equivalent per week     Comment: Occasional  . Drug Use: No  . Sexual Activity:    Partners: Male    Birth Control/ Protection: None     Comment: Tubligation   Other Topics Concern  . Not on file   Social History Narrative     Current outpatient prescriptions:  .  cholecalciferol (VITAMIN D) 1000 UNITS tablet, Take 1,000 Units by mouth daily., Disp: , Rfl:  .  ferrous sulfate 325 (65 FE) MG tablet, Take 1 tablet by mouth daily., Disp: , Rfl:  .  fluticasone (FLONASE) 50 MCG/ACT nasal spray, Place 2 sprays into both nostrils daily., Disp: 42 g,  Rfl: 0 .  loratadine (CLARITIN) 10 MG tablet, Take 1 tablet by mouth as needed., Disp: , Rfl:  .  sertraline (ZOLOFT) 100 MG tablet, Take 1 tablet (100 mg total) by mouth daily., Disp: 90 tablet, Rfl: 1 .  telmisartan-hydrochlorothiazide (MICARDIS HCT) 80-25 MG tablet, Take 1 tablet by mouth daily., Disp: 90 tablet, Rfl: 1 .  temazepam (RESTORIL) 30 MG capsule, Take 1 capsule (30 mg total) by mouth at bedtime as needed for sleep., Disp: 90 capsule, Rfl: 1  Allergies  Allergen Reactions  . Cephalosporins   . Ceftin [Cefuroxime Axetil] Rash     ROS  Constitutional: Negative for fever, positive for  weight change - losing stopped sodas, eating  smaller portions.  Respiratory: Negative for cough and shortness of breath.   Cardiovascular: Negative for chest pain or palpitations.  Gastrointestinal: Negative for abdominal pain, no bowel changes.  Musculoskeletal: Negative for gait problem or joint swelling.  Skin: Negative for rash.  Neurological: Negative for dizziness or headache.  No other specific complaints in a complete review of systems (except as listed in HPI above).  Objective  Filed Vitals:   12/17/14 1147  BP: 134/82  Pulse: 95  Temp: 98.7 F (37.1 C)  TempSrc: Oral  Resp: 16  Height: 5' (1.524 m)  Weight: 232 lb (105.235 kg)  SpO2: 99%    Body mass index is 45.31 kg/(m^2).  Physical Exam  Constitutional: Patient appears well-developed and well-nourished. No distress.  HENT: Head: Normocephalic and atraumatic. Ears: B TMs ok, no erythema or effusion; Nose: Nose normal. Mouth/Throat: Oropharynx is clear and moist. No oropharyngeal exudate.  Eyes: Conjunctivae and EOM are normal. Pupils are equal, round, and reactive to light. No scleral icterus.  Neck: Normal range of motion. Neck supple. No JVD present. No thyromegaly present.  Cardiovascular: Normal rate, regular rhythm and Systolic ejection murmur 2/6 no radiation - per patient since teenager years No BLE edema. Pulmonary/Chest: Effort normal and breath sounds normal. No respiratory distress. Abdominal: Soft. Bowel sounds are normal, no distension. There is no tenderness. no masses Breast: no lumps or masses, no nipple discharge or rashes FEMALE GENITALIA:  External genitalia normal External urethra normal Vaginal vault normal without discharge or lesions Cervix normal without discharge or lesions Bimanual exam normal without masses RECTAL: not done Musculoskeletal: Normal range of motion, no joint effusions. No gross deformities Neurological: he is alert and oriented to person, place, and time. No cranial nerve deficit. Coordination, balance, strength,  speech and gait are normal.  Skin: Skin is warm and dry. No rash noted. No erythema.  Psychiatric: Patient has a normal mood and affect. behavior is normal. Judgment and thought content normal.  Recent Results (from the past 2160 hour(s))  CBC with Differential/Platelet     Status: Abnormal   Collection Time: 11/02/14  4:01 PM  Result Value Ref Range   WBC 10.1 3.4 - 10.8 x10E3/uL   RBC 4.09 3.77 - 5.28 x10E6/uL   Hemoglobin 9.6 (L) 11.1 - 15.9 g/dL   Hematocrit 30.7 (L) 34.0 - 46.6 %   MCV 75 (L) 79 - 97 fL   MCH 23.5 (L) 26.6 - 33.0 pg   MCHC 31.3 (L) 31.5 - 35.7 g/dL   RDW 17.8 (H) 12.3 - 15.4 %   Platelets 430 (H) 150 - 379 x10E3/uL   Neutrophils 78 %   Lymphs 15 %   Monocytes 6 %   Eos 1 %   Basos 0 %   Neutrophils Absolute 7.7 (H)  1.4 - 7.0 x10E3/uL   Lymphocytes Absolute 1.6 0.7 - 3.1 x10E3/uL   Monocytes Absolute 0.6 0.1 - 0.9 x10E3/uL   EOS (ABSOLUTE) 0.1 0.0 - 0.4 x10E3/uL   Basophils Absolute 0.0 0.0 - 0.2 x10E3/uL   Immature Granulocytes 0 %   Immature Grans (Abs) 0.0 0.0 - 0.1 x10E3/uL  Ferritin     Status: None   Collection Time: 11/02/14  4:01 PM  Result Value Ref Range   Ferritin 23 15 - 150 ng/mL  Iron and TIBC     Status: Abnormal   Collection Time: 11/02/14  4:01 PM  Result Value Ref Range   Total Iron Binding Capacity 360 250 - 450 ug/dL   UIBC 330 131 - 425 ug/dL   Iron 30 27 - 159 ug/dL   Iron Saturation 8 (LL) 15 - 55 %     PHQ2/9: Depression screen [facility] 2/9 12/17/2014 10/14/2014 07/13/2014  Decreased Interest 0 0 0  Down, Depressed, Hopeless 0 0 0  PHQ - 2 Score 0 0 0     Fall Risk: Fall Risk  12/17/2014 10/14/2014 07/13/2014  Falls in the past year? No Yes No  Number falls in past yr: - 1 -  Injury with Fall? - No -     Functional Status Survey: Is the patient deaf or have difficulty hearing?: No Does the patient have difficulty seeing, even when wearing glasses/contacts?: Yes (glasses) Does the patient have difficulty concentrating,  remembering, or making decisions?: No Does the patient have difficulty walking or climbing stairs?: No Does the patient have difficulty dressing or bathing?: No Does the patient have difficulty doing errands alone such as visiting a doctor's office or shopping?: No    Assessment & Plan  1. Well woman exam  Discussed importance of 150 minutes of physical activity weekly, eat two servings of fish weekly, eat one serving of tree nuts ( cashews, pistachios, pecans, almonds.Marland Kitchen) every other day, eat 6 servings of fruit/vegetables daily and drink plenty of water and avoid sweet beverages.   2. Cervical cancer screening  - PapLb, HPV, rfx16/18

## 2014-12-17 NOTE — Patient Instructions (Signed)
Discussed importance of 150 minutes of physical activity weekly, eat two servings of fish weekly, eat one serving of tree nuts ( cashews, pistachios, pecans, almonds..) every other day, eat 6 servings of fruit/vegetables daily and drink plenty of water and avoid sweet beverages. 

## 2014-12-24 ENCOUNTER — Other Ambulatory Visit: Payer: Self-pay | Admitting: Family Medicine

## 2014-12-24 DIAGNOSIS — Z8619 Personal history of other infectious and parasitic diseases: Secondary | ICD-10-CM | POA: Insufficient documentation

## 2014-12-24 DIAGNOSIS — A599 Trichomoniasis, unspecified: Secondary | ICD-10-CM

## 2014-12-24 LAB — PAPLB, HPV, RFX16/18
HPV, HIGH-RISK: NEGATIVE
PAP Smear Comment: 0

## 2014-12-24 MED ORDER — METRONIDAZOLE 500 MG PO TABS: 2000.0000 mg | ORAL_TABLET | Freq: Every day | ORAL | Status: DC

## 2015-01-17 ENCOUNTER — Ambulatory Visit: Payer: 59 | Admitting: Family Medicine

## 2015-04-21 ENCOUNTER — Other Ambulatory Visit: Payer: Self-pay

## 2015-04-21 DIAGNOSIS — G4700 Insomnia, unspecified: Secondary | ICD-10-CM

## 2015-04-21 NOTE — Telephone Encounter (Signed)
Patient requesting refill. 

## 2015-04-25 ENCOUNTER — Other Ambulatory Visit: Payer: Self-pay

## 2015-04-25 DIAGNOSIS — G4700 Insomnia, unspecified: Secondary | ICD-10-CM

## 2015-04-25 MED ORDER — TEMAZEPAM 30 MG PO CAPS: 30.0000 mg | ORAL_CAPSULE | Freq: Every evening | ORAL | Status: DC | PRN

## 2015-04-25 NOTE — Telephone Encounter (Signed)
Patient requesting refill. 

## 2015-04-26 NOTE — Telephone Encounter (Signed)
Left message informing patient that prescription has been sent to pharmacy and that we need to schedule appointment

## 2015-05-18 ENCOUNTER — Encounter: Payer: Self-pay | Admitting: Family Medicine

## 2015-05-18 ENCOUNTER — Ambulatory Visit (INDEPENDENT_AMBULATORY_CARE_PROVIDER_SITE_OTHER): Payer: 59 | Admitting: Family Medicine

## 2015-05-18 VITALS — BP 146/92 | HR 101 | Temp 97.7°F | Resp 18 | Ht 60.0 in | Wt 242.1 lb

## 2015-05-18 DIAGNOSIS — I517 Cardiomegaly: Secondary | ICD-10-CM | POA: Diagnosis not present

## 2015-05-18 DIAGNOSIS — I10 Essential (primary) hypertension: Secondary | ICD-10-CM

## 2015-05-18 DIAGNOSIS — N951 Menopausal and female climacteric states: Secondary | ICD-10-CM | POA: Diagnosis not present

## 2015-05-18 DIAGNOSIS — E559 Vitamin D deficiency, unspecified: Secondary | ICD-10-CM

## 2015-05-18 DIAGNOSIS — G4700 Insomnia, unspecified: Secondary | ICD-10-CM | POA: Diagnosis not present

## 2015-05-18 DIAGNOSIS — R232 Flushing: Secondary | ICD-10-CM

## 2015-05-18 DIAGNOSIS — F324 Major depressive disorder, single episode, in partial remission: Secondary | ICD-10-CM

## 2015-05-18 DIAGNOSIS — D509 Iron deficiency anemia, unspecified: Secondary | ICD-10-CM

## 2015-05-18 DIAGNOSIS — R0683 Snoring: Secondary | ICD-10-CM | POA: Diagnosis not present

## 2015-05-18 DIAGNOSIS — R5383 Other fatigue: Secondary | ICD-10-CM

## 2015-05-18 DIAGNOSIS — Z8742 Personal history of other diseases of the female genital tract: Secondary | ICD-10-CM | POA: Diagnosis not present

## 2015-05-18 DIAGNOSIS — Z8619 Personal history of other infectious and parasitic diseases: Secondary | ICD-10-CM

## 2015-05-18 MED ORDER — SERTRALINE HCL 100 MG PO TABS: 100.0000 mg | ORAL_TABLET | Freq: Every day | ORAL | Status: DC

## 2015-05-18 MED ORDER — BUPROPION HCL ER (XL) 150 MG PO TB24: 150.0000 mg | ORAL_TABLET | Freq: Every day | ORAL | Status: DC

## 2015-05-18 MED ORDER — TELMISARTAN-HCTZ 80-25 MG PO TABS: 1.0000 | ORAL_TABLET | Freq: Every day | ORAL | Status: DC

## 2015-05-18 NOTE — Progress Notes (Signed)
Name: Brooke Page   MRN: [id number]    DOB: 05-03-1970   Date:05/18/2015       Progress Note  Subjective  Chief Complaint  Chief Complaint  Patient presents with  . Medication Refill    6 month F/U  . Hypertension    Has been out of BP Medication for a couple of days  . Insomnia    Medication works well, just has trouble falling back asleep after using the restroom at 2 a.m. and will not be able to go back to the backroom afterwards  . Fatigue    Has been very tired and would like her Iron checked   . Allergic Rhinitis     Well controlled    HPI  HTN: our of bp medication for the past few days. She denies  chest pain,  palpitation, or  SOB  Major Depression Mild in partial remission: still grieving the loss of her brother , she has noticed recurrence of anhedonia and fatigue. She feels like she could cry anytime She is taking Zoloft and is tolerating it well. She does not want to change dose of medication . Denies suicidal thoughts or ideation  Insomnia: taking Temazepam and is helping her fall and stay asleep, no side effects  AR: she is doing well on Flonase and Claritin, no sneezing, cough or rhinorrhea.   Anemia: history of severe iron deficiency anemia last year, went to [facility] with hgb of 5.9% back in 2015 , seen by Dr. Inez Pilgrim and recevied iron infusion in the past and two blood transfusion. She states she is not feeling as tired now, taking ferrous sulfate 1 times daily, also eats an iron rich diet. Had endometrial ablation in 2015, and only occasionally has a spotting. She had a negative colonoscopy. Feeling tired again  LV hypertrophy and snoring: discussed sleep study and importance of bp control   Patient Active Problem List   Diagnosis Date Noted  . Ventricular hypertrophy determined by echocardiography 05/18/2015  . Trichomonal infection 12/24/2014  . Family history of colon cancer 07/13/2014  . Family history of breast cancer 07/13/2014  . Mitral regurgitation  07/11/2014  . Hiatal hernia without gangrene and obstruction 07/11/2014  . Seasonal allergic rhinitis 07/11/2014  . History of pre-eclampsia 07/11/2014  . Type a blood, rh positive 07/11/2014  . Intramural leiomyoma of uterus 07/11/2014  . History of cervical dysplasia 07/11/2014  . Major depression in partial remission ([place]) 07/11/2014  . Controlled insomnia 07/11/2014  . Menorrhagia with regular cycle 07/11/2014  . Obesity (BMI 30.0-34.9) 07/11/2014  . History of iron deficiency anemia 07/11/2014  . Vitamin D deficiency 07/11/2014  . Hypertension, benign 07/11/2014  . History of blood transfusion 07/11/2014  . History of pulmonary embolism 05/08/2013  . History of DVT (deep vein thrombosis) 05/08/2013    Past Surgical History  Procedure Laterality Date  . Tonsillectomy    . Abdominal hysterectomy    . Peripheral vascular catheterization N/A 05/14/2014    Procedure: ivc filter removal;  Surgeon: Katha Cabal, MD;  Location: [place] [facility];  Service: Cardiovascular;  Laterality: N/A;  . Venogram N/A 05/14/2014    Procedure: Venogram;  Surgeon: Katha Cabal, MD;  Location: [place] [facility];  Service: Cardiovascular;  Laterality: N/A;  IVC  . Leep  2000  . Cesarean section    . Endometrial biopsy  05/27/13    no hyperplasia or carcionam, weakly proliferative endometrium,stomal and or glandular breakdown- [facility]    Family  History  Problem Relation Age of Onset  . Cancer Mother     Colon and Breast  . Hypertension Mother   . Breast cancer Mother 75  . Diabetes Father   . Hypertension Father   . Diabetes Brother   . Pulmonary embolism Brother   . Obesity Brother   . Asthma Son     Social History   Social History  . Marital Status: Married    Spouse Name: N/A  . Number of Children: N/A  . Years of Education: N/A   Occupational History  . Not on file.   Social History Main Topics  . Smoking status: Never Smoker   . Smokeless tobacco: Never Used   . Alcohol Use: 0.0 oz/week    0 Standard drinks or equivalent per week     Comment: Occasional  . Drug Use: No  . Sexual Activity:    Partners: Male    Birth Control/ Protection: None     Comment: Tubligation   Other Topics Concern  . Not on file   Social History Narrative     Current outpatient prescriptions:  .  cholecalciferol (VITAMIN D) 1000 UNITS tablet, Take 1,000 Units by mouth daily., Disp: , Rfl:  .  ferrous sulfate 325 (65 FE) MG tablet, Take 1 tablet by mouth daily., Disp: , Rfl:  .  fluticasone (FLONASE) 50 MCG/ACT nasal spray, Place 2 sprays into both nostrils daily., Disp: 42 g, Rfl: 0 .  loratadine (CLARITIN) 10 MG tablet, Take 1 tablet by mouth as needed., Disp: , Rfl:  .  sertraline (ZOLOFT) 100 MG tablet, Take 1 tablet (100 mg total) by mouth daily., Disp: 90 tablet, Rfl: 1 .  telmisartan-hydrochlorothiazide (MICARDIS HCT) 80-25 MG tablet, Take 1 tablet by mouth daily., Disp: 90 tablet, Rfl: 1 .  temazepam (RESTORIL) 30 MG capsule, Take 1 capsule (30 mg total) by mouth at bedtime as needed for sleep., Disp: 90 capsule, Rfl: 0  Allergies  Allergen Reactions  . Cephalosporins   . Ceftin [Cefuroxime Axetil] Rash     ROS  Constitutional: Negative for fever, positive for  weight change - 10 lbs in the past 6 months.  Respiratory: Negative for cough and shortness of breath.   Cardiovascular: Negative for chest pain or palpitations.  Gastrointestinal: Negative for abdominal pain, no bowel changes.  Musculoskeletal: Negative for gait problem or joint swelling.  Skin: Negative for rash.  Neurological: Negative for dizziness or headache.  No other specific complaints in a complete review of systems (except as listed in HPI above).  Objective  Filed Vitals:   05/18/15 1339  BP: 146/92  Pulse: 101  Temp: 97.7 F (36.5 C)  TempSrc: Oral  Resp: 18  Height: 5' (1.524 m)  Weight: 242 lb 1.6 oz (109.816 kg)  SpO2: 96%    Body mass index is 47.28  kg/(m^2).  Physical Exam  Constitutional: Patient appears well-developed and well-nourished. Obese  No distress.  HEENT: head atraumatic, normocephalic, pupils equal and reactive to light,  neck supple, throat within normal limits Cardiovascular: Normal rate, regular rhythm and normal heart sounds.  No murmur heard. No BLE edema. Pulmonary/Chest: Effort normal and breath sounds normal. No respiratory distress. Abdominal: Soft.  There is no tenderness. Psychiatric: Patient has a normal mood and affect. behavior is normal. Judgment and thought content normal.   PHQ2/9: Depression screen [facility] 2/9 05/18/2015 12/17/2014 10/14/2014 07/13/2014  Decreased Interest 0 0 0 0  Down, Depressed, Hopeless 0 0 0 0  PHQ -  2 Score 0 0 0 0     Fall Risk: Fall Risk  05/18/2015 12/17/2014 10/14/2014 07/13/2014  Falls in the past year? No No Yes No  Number falls in past yr: - - 1 -  Injury with Fall? - - No -      Functional Status Survey: Is the patient deaf or have difficulty hearing?: No Does the patient have difficulty seeing, even when wearing glasses/contacts?: No Does the patient have difficulty concentrating, remembering, or making decisions?: No Does the patient have difficulty walking or climbing stairs?: No Does the patient have difficulty dressing or bathing?: No Does the patient have difficulty doing errands alone such as visiting a doctor's office or shopping?: No    Assessment & Plan  1. Ventricular hypertrophy determined by echocardiography  Explained importance of keeping bp under control   2. Hypertension, benign  Resume medication, bp elevated because she has been out of it for a few days now - telmisartan-hydrochlorothiazide (MICARDIS HCT) 80-25 MG tablet; Take 1 tablet by mouth daily.  Dispense: 90 tablet; Refill: 1 - Comprehensive metabolic panel  3. Major depressive disorder with single episode, in partial remission (HCC)  - sertraline (ZOLOFT) 100 MG tablet; Take 1 tablet  (100 mg total) by mouth daily.  Dispense: 90 tablet; Refill: 1 - buPROPion (WELLBUTRIN XL) 150 MG 24 hr tablet; Take 1 tablet (150 mg total) by mouth daily.  Dispense: 90 tablet; Refill: 0  4. Controlled insomnia  Doing well on Temazepam  5. Iron deficiency anemia  S/p ablation, occasionally has some spotting now - CBC with Differential/Platelet - Ferritin - Iron and TIBC  6. Vitamin D deficiency  - VITAMIN D 25 Hydroxy (Vit-D Deficiency, Fractures)  7. Other fatigue  - Vitamin B12 - TSH  8. History of trichomonal vaginitis  - Chlamydia/Gonococcus/Trichomonas, NAA  9. Hot flashes  - FSH/LH  10. Snoring  ESS 6, fatigue likely secondary to depression

## 2015-05-19 LAB — COMPREHENSIVE METABOLIC PANEL
A/G RATIO: 1.6 (ref 1.2–2.2)
ALBUMIN: 4.2 g/dL (ref 3.5–5.5)
ALT: 8 IU/L (ref 0–32)
AST: 15 IU/L (ref 0–40)
Alkaline Phosphatase: 53 IU/L (ref 39–117)
BILIRUBIN TOTAL: 0.2 mg/dL (ref 0.0–1.2)
BUN/Creatinine Ratio: 14 (ref 9–23)
BUN: 10 mg/dL (ref 6–24)
CHLORIDE: 96 mmol/L (ref 96–106)
CO2: 26 mmol/L (ref 18–29)
Calcium: 9.3 mg/dL (ref 8.7–10.2)
Creatinine, Ser: 0.73 mg/dL (ref 0.57–1.00)
GFR, EST AFRICAN AMERICAN: 117 mL/min/{1.73_m2} (ref >59)
GFR, EST NON AFRICAN AMERICAN: 102 mL/min/{1.73_m2} (ref >59)
Globulin, Total: 2.7 g/dL (ref 1.5–4.5)
Glucose: 91 mg/dL (ref 65–99)
POTASSIUM: 3.9 mmol/L (ref 3.5–5.2)
Sodium: 137 mmol/L (ref 134–144)
TOTAL PROTEIN: 6.9 g/dL (ref 6.0–8.5)

## 2015-05-19 LAB — FSH/LH
FSH: 6.6 m[IU]/mL
LH: 15.4 m[IU]/mL

## 2015-05-19 LAB — CBC WITH DIFFERENTIAL/PLATELET
BASOS: 0 %
Basophils Absolute: 0 10*3/uL (ref 0.0–0.2)
EOS (ABSOLUTE): 0.1 10*3/uL (ref 0.0–0.4)
Eos: 1 %
HEMOGLOBIN: 10.7 g/dL — AB (ref 11.1–15.9)
Hematocrit: 34.6 % (ref 34.0–46.6)
IMMATURE GRANS (ABS): 0 10*3/uL (ref 0.0–0.1)
Immature Granulocytes: 0 %
LYMPHS: 22 %
Lymphocytes Absolute: 1.5 10*3/uL (ref 0.7–3.1)
MCH: 23.7 pg — AB (ref 26.6–33.0)
MCHC: 30.9 g/dL — AB (ref 31.5–35.7)
MCV: 77 fL — AB (ref 79–97)
MONOCYTES: 9 %
Monocytes Absolute: 0.6 10*3/uL (ref 0.1–0.9)
NEUTROS ABS: 4.7 10*3/uL (ref 1.4–7.0)
Neutrophils: 68 %
Platelets: 345 10*3/uL (ref 150–379)
RBC: 4.51 x10E6/uL (ref 3.77–5.28)
RDW: 16.1 % — ABNORMAL HIGH (ref 12.3–15.4)
WBC: 7 10*3/uL (ref 3.4–10.8)

## 2015-05-19 LAB — IRON AND TIBC
IRON SATURATION: 34 % (ref 15–55)
IRON: 137 ug/dL (ref 27–159)
TIBC: 408 ug/dL (ref 250–450)
UIBC: 271 ug/dL (ref 131–425)

## 2015-05-19 LAB — TSH: TSH: 1.97 u[IU]/mL (ref 0.450–4.500)

## 2015-05-19 LAB — VITAMIN D 25 HYDROXY (VIT D DEFICIENCY, FRACTURES): Vit D, 25-Hydroxy: 33.9 ng/mL (ref 30.0–100.0)

## 2015-05-19 LAB — FERRITIN: FERRITIN: 17 ng/mL (ref 15–150)

## 2015-05-19 LAB — VITAMIN B12: Vitamin B-12: 492 pg/mL (ref 211–946)

## 2015-06-29 ENCOUNTER — Ambulatory Visit: Payer: 59 | Admitting: Family Medicine

## 2015-07-06 ENCOUNTER — Other Ambulatory Visit: Payer: Self-pay | Admitting: Family Medicine

## 2015-07-06 NOTE — Telephone Encounter (Signed)
Patient requesting refill. 

## 2015-07-13 ENCOUNTER — Ambulatory Visit: Payer: 59 | Admitting: Family Medicine

## 2015-08-08 ENCOUNTER — Ambulatory Visit (INDEPENDENT_AMBULATORY_CARE_PROVIDER_SITE_OTHER): Payer: 59 | Admitting: Family Medicine

## 2015-08-08 ENCOUNTER — Encounter: Payer: Self-pay | Admitting: Family Medicine

## 2015-08-08 VITALS — BP 128/80 | HR 78 | Temp 98.4°F | Resp 18 | Ht 60.0 in | Wt 245.2 lb

## 2015-08-08 DIAGNOSIS — I10 Essential (primary) hypertension: Secondary | ICD-10-CM | POA: Diagnosis not present

## 2015-08-08 DIAGNOSIS — G4700 Insomnia, unspecified: Secondary | ICD-10-CM

## 2015-08-08 DIAGNOSIS — F324 Major depressive disorder, single episode, in partial remission: Secondary | ICD-10-CM

## 2015-08-08 MED ORDER — TEMAZEPAM 30 MG PO CAPS: 30.0000 mg | ORAL_CAPSULE | Freq: Every evening | ORAL | 0 refills | Status: DC | PRN

## 2015-08-08 MED ORDER — BUPROPION HCL ER (XL) 300 MG PO TB24: 300.0000 mg | ORAL_TABLET | Freq: Every day | ORAL | 0 refills | Status: DC

## 2015-08-08 NOTE — Progress Notes (Signed)
Name: Brooke Page   MRN: [id number]    DOB: [birth year]   Date:08/08/2015       Progress Note  Subjective  Chief Complaint  Chief Complaint  Patient presents with  . Depression    pt here for follow up visit and refills  . Hypertension  . Medication Refill    HPI  HTN: she is back on bp medication, no side effects. She denies chest pain,  palpitation, or  SOB. BP at home has been in the 120's/80's  Major Depression Mild : still grieving the loss of her brother , she has noticed recurrence of anhedonia and fatigue. On her last visit she felt like she was very jumpy and teary. She was  taking Zoloft and Wellbutrin was added 2 months ago. She has noticed some improvement of symptoms. She is crying at most once every two weeks. She is very stressed/overwhelmed at work - one co-worker was fired and another one retired. The work volume increased, but has been able to cope. Denies suicidal thoughts or ideation. We will increase dose of Wellbutrin XL  Insomnia: taking Temazepam and is helping her fall and stay asleep, no side effects  Morbid obesity: she has been obese since her teens, but worse after the first child was born 63 years ago her weight was 200 lbs, she lost some weight, but after the birth of her daughter she went down to 220 lbs, after the twins her weight was up to 286 lbs. She lost weight after she nursed the twins, and went down to 220 lbs. However over the past 6 years she has been gradually gained weight again. She is a stress eater and has been eating more at work lately. No physically active. She states she will cut down on calorie intake and drink more water. She will lose some weight before we can start on medication    Patient Active Problem List   Diagnosis Date Noted  . Ventricular hypertrophy determined by echocardiography 05/18/2015  . History of trichomonal vaginitis 12/24/2014  . Family history of colon cancer 07/13/2014  . Family history of breast cancer  07/13/2014  . Mitral regurgitation 07/11/2014  . Hiatal hernia without gangrene and obstruction 07/11/2014  . Seasonal allergic rhinitis 07/11/2014  . History of pre-eclampsia 07/11/2014  . Type a blood, rh positive 07/11/2014  . Intramural leiomyoma of uterus 07/11/2014  . History of cervical dysplasia 07/11/2014  . Major depression in partial remission ([place]) 07/11/2014  . Controlled insomnia 07/11/2014  . Menorrhagia with regular cycle 07/11/2014  . Obesity (BMI 30.0-34.9) 07/11/2014  . History of iron deficiency anemia 07/11/2014  . Vitamin D deficiency 07/11/2014  . Hypertension, benign 07/11/2014  . History of blood transfusion 07/11/2014  . History of pulmonary embolism 05/08/2013  . History of DVT (deep vein thrombosis) 05/08/2013    Past Surgical History:  Procedure Laterality Date  . ABDOMINAL HYSTERECTOMY    . CESAREAN SECTION    . ENDOMETRIAL BIOPSY  05/27/13   no hyperplasia or carcionam, weakly proliferative endometrium,stomal and or glandular breakdown- [facility]  . LEEP  2000  . PERIPHERAL VASCULAR CATHETERIZATION N/A 05/14/2014   Procedure: ivc filter removal;  Surgeon: Katha Cabal, MD;  Location: [facility];  Service: Cardiovascular;  Laterality: N/A;  . TONSILLECTOMY    . VENOGRAM N/A 05/14/2014   Procedure: Venogram;  Surgeon: Katha Cabal, MD;  Location: [facility];  Service: Cardiovascular;  Laterality: N/A;  IVC    Family  History  Problem Relation Age of Onset  . Cancer Mother     Colon and Breast  . Hypertension Mother   . Breast cancer Mother 34  . Diabetes Father   . Hypertension Father   . Diabetes Brother   . Pulmonary embolism Brother   . Obesity Brother   . Asthma Son     Social History   Social History  . Marital status: Married    Spouse name: N/A  . Number of children: N/A  . Years of education: N/A   Occupational History  . Not on file.   Social History Main Topics  . Smoking status: Never Smoker  .  Smokeless tobacco: Never Used  . Alcohol use 0.0 oz/week     Comment: Occasional  . Drug use: No  . Sexual activity: Yes    Partners: Male    Birth control/ protection: None     Comment: Tubligation   Other Topics Concern  . Not on file   Social History Narrative  . No narrative on file     Current Outpatient Prescriptions:  .  buPROPion (WELLBUTRIN XL) 300 MG 24 hr tablet, Take 1 tablet (300 mg total) by mouth daily., Disp: 90 tablet, Rfl: 0 .  cholecalciferol (VITAMIN D) 1000 UNITS tablet, Take 1,000 Units by mouth daily., Disp: , Rfl:  .  ferrous sulfate 325 (65 FE) MG tablet, Take 1 tablet by mouth daily., Disp: , Rfl:  .  fluticasone (FLONASE) 50 MCG/ACT nasal spray, Place 2 sprays into both nostrils daily., Disp: 42 g, Rfl: 0 .  loratadine (CLARITIN) 10 MG tablet, Take 1 tablet by mouth as needed., Disp: , Rfl:  .  sertraline (ZOLOFT) 100 MG tablet, Take 1 tablet (100 mg total) by mouth daily., Disp: 90 tablet, Rfl: 1 .  telmisartan-hydrochlorothiazide (MICARDIS HCT) 80-25 MG tablet, Take 1 tablet by mouth daily., Disp: 90 tablet, Rfl: 1 .  temazepam (RESTORIL) 30 MG capsule, Take 1 capsule (30 mg total) by mouth at bedtime as needed for sleep., Disp: 90 capsule, Rfl: 0  Allergies  Allergen Reactions  . Cephalosporins   . Ceftin [Cefuroxime Axetil] Rash     ROS  Constitutional: Negative for fever, positive for mild  weight change.  Respiratory: Negative for cough and shortness of breath.   Cardiovascular: Negative for chest pain or palpitations.  Gastrointestinal: Negative for abdominal pain, no bowel changes.  Musculoskeletal: Negative for gait problem or joint swelling.  Skin: Negative for rash.  Neurological: Negative for dizziness or headache.  No other specific complaints in a complete review of systems (except as listed in HPI above).  Objective  Vitals:   08/08/15 1602  BP: 128/80  Pulse: 78  Resp: 18  Temp: 98.4 F (36.9 C)  SpO2: 98%  Weight: 245  lb 4 oz (111.2 kg)  Height: 5' (1.524 m)    Body mass index is 47.9 kg/m.  Physical Exam  Constitutional: Patient appears well-developed and well-nourished. Obese No distress.  HEENT: head atraumatic, normocephalic, pupils equal and reactive to light, neck supple, throat within normal limits Cardiovascular: Normal rate, regular rhythm and normal heart sounds.  No murmur heard. No BLE edema. Pulmonary/Chest: Effort normal and breath sounds normal. No respiratory distress. Abdominal: Soft.  There is no tenderness. Psychiatric: Patient has a normal mood and affect. behavior is normal. Judgment and thought content normal.  Recent Results (from the past 2160 hour(s))  Comprehensive metabolic panel     Status: None   Collection Time:  05/18/15  2:20 PM  Result Value Ref Range   Glucose 91 65 - 99 mg/dL   BUN 10 6 - 24 mg/dL   Creatinine, Ser 0.73 0.57 - 1.00 mg/dL   GFR calc non Af Amer 102 >59 mL/min/1.73   GFR calc Af Amer 117 >59 mL/min/1.73   BUN/Creatinine Ratio 14 9 - 23   Sodium 137 134 - 144 mmol/L   Potassium 3.9 3.5 - 5.2 mmol/L   Chloride 96 96 - 106 mmol/L   CO2 26 18 - 29 mmol/L   Calcium 9.3 8.7 - 10.2 mg/dL   Total Protein 6.9 6.0 - 8.5 g/dL   Albumin 4.2 3.5 - 5.5 g/dL   Globulin, Total 2.7 1.5 - 4.5 g/dL   Albumin/Globulin Ratio 1.6 1.2 - 2.2   Bilirubin Total 0.2 0.0 - 1.2 mg/dL   Alkaline Phosphatase 53 39 - 117 IU/L   AST 15 0 - 40 IU/L   ALT 8 0 - 32 IU/L  CBC with Differential/Platelet     Status: Abnormal   Collection Time: 05/18/15  2:20 PM  Result Value Ref Range   WBC 7.0 3.4 - 10.8 x10E3/uL   RBC 4.51 3.77 - 5.28 x10E6/uL   Hemoglobin 10.7 (L) 11.1 - 15.9 g/dL   Hematocrit 34.6 34.0 - 46.6 %   MCV 77 (L) 79 - 97 fL   MCH 23.7 (L) 26.6 - 33.0 pg   MCHC 30.9 (L) 31.5 - 35.7 g/dL   RDW 16.1 (H) 12.3 - 15.4 %   Platelets 345 150 - 379 x10E3/uL   Neutrophils 68 %   Lymphs 22 %   Monocytes 9 %   Eos 1 %   Basos 0 %   Neutrophils Absolute 4.7 1.4 -  7.0 x10E3/uL   Lymphocytes Absolute 1.5 0.7 - 3.1 x10E3/uL   Monocytes Absolute 0.6 0.1 - 0.9 x10E3/uL   EOS (ABSOLUTE) 0.1 0.0 - 0.4 x10E3/uL   Basophils Absolute 0.0 0.0 - 0.2 x10E3/uL   Immature Granulocytes 0 %   Immature Grans (Abs) 0.0 0.0 - 0.1 x10E3/uL  Ferritin     Status: None   Collection Time: 05/18/15  2:20 PM  Result Value Ref Range   Ferritin 17 15 - 150 ng/mL  Iron and TIBC     Status: None   Collection Time: 05/18/15  2:20 PM  Result Value Ref Range   Total Iron Binding Capacity 408 250 - 450 ug/dL   UIBC 271 131 - 425 ug/dL   Iron 137 27 - 159 ug/dL   Iron Saturation 34 15 - 55 %  VITAMIN D 25 Hydroxy (Vit-D Deficiency, Fractures)     Status: None   Collection Time: 05/18/15  2:20 PM  Result Value Ref Range   Vit D, 25-Hydroxy 33.9 30.0 - 100.0 ng/mL    Comment: Vitamin D deficiency has been defined by the Institute of Medicine and an Endocrine Society practice guideline as a level of serum 25-OH vitamin D less than 20 ng/mL (1,2). The Endocrine Society went on to further define vitamin D insufficiency as a level between 21 and 29 ng/mL (2). 1. IOM (Institute of Medicine). 2010. Dietary reference    intakes for calcium and D. [place]: The    [employer]. 2. Holick MF, Binkley [place], Bischoff-Ferrari HA, et al.    Evaluation, treatment, and prevention of vitamin D    deficiency: an Endocrine Society clinical practice    guideline. JCEM. 2011 Jul; 96(7):1911-30.   Vitamin B12  Status: None   Collection Time: 05/18/15  2:20 PM  Result Value Ref Range   Vitamin B-12 492 211 - 946 pg/mL  TSH     Status: None   Collection Time: 05/18/15  2:20 PM  Result Value Ref Range   TSH 1.970 0.450 - 4.500 uIU/mL  FSH/LH     Status: None   Collection Time: 05/18/15  2:20 PM  Result Value Ref Range   LH 15.4 mIU/mL    Comment:                     Adult Female:                       Follicular phase      2.4 -  12.6                       Ovulation  phase      14.0 -  95.6                       Luteal phase          1.0 -  11.4                       Postmenopausal        7.7 -  58.5    FSH 6.6 mIU/mL    Comment:                     Adult Female:                       Follicular phase      3.5 -  12.5                       Ovulation phase       4.7 -  21.5                       Luteal phase          1.7 -   7.7                       Postmenopausal       25.8 - 134.8      PHQ2/9: Depression screen [facility] 2/9 08/08/2015 05/18/2015 12/17/2014 10/14/2014 07/13/2014  Decreased Interest 1 0 0 0 0  Down, Depressed, Hopeless 0 0 0 0 0  PHQ - 2 Score 1 0 0 0 0     Fall Risk: Fall Risk  08/08/2015 05/18/2015 12/17/2014 10/14/2014 07/13/2014  Falls in the past year? No No No Yes No  Number falls in past yr: - - - 1 -  Injury with Fall? - - - No -     Functional Status Survey: Is the patient deaf or have difficulty hearing?: No Does the patient have difficulty seeing, even when wearing glasses/contacts?: Yes Does the patient have difficulty concentrating, remembering, or making decisions?: No Does the patient have difficulty walking or climbing stairs?: No Does the patient have difficulty dressing or bathing?: No Does the patient have difficulty doing errands alone such as visiting a doctor's office or shopping?: No   Assessment & Plan  1. Major depressive disorder with single episode, in partial remission (HCC)  - buPROPion (WELLBUTRIN XL) 300 MG 24 hr tablet;  Take 1 tablet (300 mg total) by mouth daily.  Dispense: 90 tablet; Refill: 0  2. Hypertension, benign  Continue medication   3. Controlled insomnia  - temazepam (RESTORIL) 30 MG capsule; Take 1 capsule (30 mg total) by mouth at bedtime as needed for sleep.  Dispense: 90 capsule; Refill: 0   4. Morbid obesity, unspecified obesity type [facility])  Discussed with the patient the risk posed by an increased BMI. Discussed importance of portion control, calorie counting and at least 150  minutes of physical activity weekly. Avoid sweet beverages and drink more water. Eat at least 6 servings of fruit and vegetables daily

## 2015-09-01 ENCOUNTER — Other Ambulatory Visit: Payer: Self-pay | Admitting: Family Medicine

## 2015-09-01 DIAGNOSIS — Z1231 Encounter for screening mammogram for malignant neoplasm of breast: Secondary | ICD-10-CM

## 2015-09-15 ENCOUNTER — Ambulatory Visit
Admission: RE | Admit: 2015-09-15 | Discharge: 2015-09-15 | Disposition: A | Discharge status: Home / Self Care | Payer: 59 | Source: Ambulatory Visit | Attending: Family Medicine | Admitting: Family Medicine

## 2015-09-15 DIAGNOSIS — Z1231 Encounter for screening mammogram for malignant neoplasm of breast: Secondary | ICD-10-CM | POA: Diagnosis present

## 2015-09-19 ENCOUNTER — Ambulatory Visit: Payer: 59 | Admitting: Family Medicine

## 2015-10-24 ENCOUNTER — Encounter: Payer: Self-pay | Admitting: Family Medicine

## 2015-10-24 ENCOUNTER — Ambulatory Visit (INDEPENDENT_AMBULATORY_CARE_PROVIDER_SITE_OTHER): Payer: 59 | Admitting: Family Medicine

## 2015-10-24 VITALS — BP 128/82 | HR 116 | Temp 98.5°F | Resp 16 | Ht 60.0 in | Wt 242.2 lb

## 2015-10-24 DIAGNOSIS — Z23 Encounter for immunization: Secondary | ICD-10-CM | POA: Diagnosis not present

## 2015-10-24 DIAGNOSIS — D500 Iron deficiency anemia secondary to blood loss (chronic): Secondary | ICD-10-CM | POA: Diagnosis not present

## 2015-10-24 DIAGNOSIS — F324 Major depressive disorder, single episode, in partial remission: Secondary | ICD-10-CM | POA: Diagnosis not present

## 2015-10-24 DIAGNOSIS — I10 Essential (primary) hypertension: Secondary | ICD-10-CM

## 2015-10-24 DIAGNOSIS — G4700 Insomnia, unspecified: Secondary | ICD-10-CM

## 2015-10-24 DIAGNOSIS — Z8719 Personal history of other diseases of the digestive system: Secondary | ICD-10-CM

## 2015-10-24 DIAGNOSIS — Z8711 Personal history of peptic ulcer disease: Secondary | ICD-10-CM

## 2015-10-24 DIAGNOSIS — E559 Vitamin D deficiency, unspecified: Secondary | ICD-10-CM

## 2015-10-24 MED ORDER — TELMISARTAN-HCTZ 80-25 MG PO TABS: 1.0000 | ORAL_TABLET | Freq: Every day | ORAL | 1 refills | Status: DC

## 2015-10-24 MED ORDER — TEMAZEPAM 30 MG PO CAPS: 30.0000 mg | ORAL_CAPSULE | Freq: Every evening | ORAL | 0 refills | Status: DC | PRN

## 2015-10-24 MED ORDER — BUPROPION HCL ER (XL) 300 MG PO TB24: 300.0000 mg | ORAL_TABLET | Freq: Every day | ORAL | 1 refills | Status: DC

## 2015-10-24 MED ORDER — LORCASERIN HCL ER 20 MG PO TB24: 1.0000 | ORAL_TABLET | Freq: Every day | ORAL | 2 refills | Status: DC

## 2015-10-24 MED ORDER — SERTRALINE HCL 100 MG PO TABS: 100.0000 mg | ORAL_TABLET | Freq: Every day | ORAL | 1 refills | Status: DC

## 2015-10-24 NOTE — Progress Notes (Signed)
Name: Brooke Page   MRN: [id number]    DOB: May 17, 1970   Date:10/24/2015       Progress Note  Subjective  Chief Complaint  Chief Complaint  Patient presents with  . Depression  . Flu Vaccine    HPI  HTN: she is back on bp medication, no side effects. She denieschest pain, edema, palpitation, or SOB. BP at home has been in the 120's/80's  Major Depression Mild : still grieving the loss of her brother,she is doing well, anniversary of his death was 10/13/15 and she did very well ( 3 years ago ) She has been on Zoloft for many years ( started with the birth of twins ), but we adding Wellbutrin Summer 2017.  No longer having crying spells. She is very stressed/overwhelmed at work - one co-worker was fired and another one retired. The work volume increased, but has been able to cope. Denies suicidal thoughts or ideation.   Insomnia: taking Temazepam and is helping her fall and stay asleep, no side effects  Morbid obesity: she has been obese since her teens, but worse after the first child was born 83 years ago her weight was 200 lbs, she lost some weight, but after the birth of her daughter she went down to 220 lbs, after the twins her weight was up to 286 lbs. She lost weight after she nursed the twins, and went down to 220 lbs. However over the past 6 years she has been gradually gained weight again. She is a stress eater and has been eating more at work lately. No physically active. She states she will cut down on calorie intake and drink more water. She lost a few pounds since last visit, she would like to try medication    Patient Active Problem List   Diagnosis Date Noted  . Obesity, morbid ([place]) 08/08/2015  . Ventricular hypertrophy determined by echocardiography 05/18/2015  . History of trichomonal vaginitis 12/24/2014  . Family history of colon cancer 07/13/2014  . Family history of breast cancer 07/13/2014  . Mitral regurgitation 07/11/2014  . Hiatal hernia  without gangrene and obstruction 07/11/2014  . Seasonal allergic rhinitis 07/11/2014  . History of pre-eclampsia 07/11/2014  . Type A blood, Rh positive 07/11/2014  . Intramural leiomyoma of uterus 07/11/2014  . History of cervical dysplasia 07/11/2014  . Major depression in partial remission ([place]) 07/11/2014  . Controlled insomnia 07/11/2014  . Menorrhagia with regular cycle 07/11/2014  . Obesity (BMI 30.0-34.9) 07/11/2014  . History of iron deficiency anemia 07/11/2014  . Vitamin D deficiency 07/11/2014  . Hypertension, benign 07/11/2014  . History of blood transfusion 07/11/2014  . History of pulmonary embolism 05/08/2013  . History of DVT (deep vein thrombosis) 05/08/2013    Past Surgical History:  Procedure Laterality Date  . ABDOMINAL HYSTERECTOMY    . CESAREAN SECTION    . ENDOMETRIAL BIOPSY  05/27/13   no hyperplasia or carcionam, weakly proliferative endometrium,stomal and or glandular breakdown- [facility]  . LEEP  2000  . PERIPHERAL VASCULAR CATHETERIZATION N/A 05/14/2014   Procedure: ivc filter removal;  Surgeon: Katha Cabal, MD;  Location: [facility];  Service: Cardiovascular;  Laterality: N/A;  . TONSILLECTOMY    . VENOGRAM N/A 05/14/2014   Procedure: Venogram;  Surgeon: Katha Cabal, MD;  Location: [facility];  Service: Cardiovascular;  Laterality: N/A;  IVC    Family History  Problem Relation Age of Onset  . Cancer Mother  Colon and Breast  . Hypertension Mother   . Breast cancer Mother 1  . Diabetes Father   . Hypertension Father   . Diabetes Brother   . Pulmonary embolism Brother   . Obesity Brother   . Asthma Son     Social History   Social History  . Marital status: Married    Spouse name: N/A  . Number of children: N/A  . Years of education: N/A   Occupational History  . Not on file.   Social History Main Topics  . Smoking status: Never Smoker  . Smokeless tobacco: Never Used  . Alcohol use 0.0 oz/week      Comment: Occasional  . Drug use: No  . Sexual activity: Yes    Partners: Male    Birth control/ protection: None     Comment: Tubligation   Other Topics Concern  . Not on file   Social History Narrative  . No narrative on file     Current Outpatient Prescriptions:  .  buPROPion (WELLBUTRIN XL) 300 MG 24 hr tablet, Take 1 tablet (300 mg total) by mouth daily., Disp: 90 tablet, Rfl: 1 .  cholecalciferol (VITAMIN D) 1000 UNITS tablet, Take 1,000 Units by mouth daily., Disp: , Rfl:  .  ferrous sulfate 325 (65 FE) MG tablet, Take 1 tablet by mouth daily., Disp: , Rfl:  .  fluticasone (FLONASE) 50 MCG/ACT nasal spray, Place 2 sprays into both nostrils daily., Disp: 42 g, Rfl: 0 .  sertraline (ZOLOFT) 100 MG tablet, Take 1 tablet (100 mg total) by mouth daily., Disp: 90 tablet, Rfl: 1 .  telmisartan-hydrochlorothiazide (MICARDIS HCT) 80-25 MG tablet, Take 1 tablet by mouth daily., Disp: 90 tablet, Rfl: 1 .  temazepam (RESTORIL) 30 MG capsule, Take 1 capsule (30 mg total) by mouth at bedtime as needed for sleep., Disp: 90 capsule, Rfl: 0 .  loratadine (CLARITIN) 10 MG tablet, Take 1 tablet by mouth as needed., Disp: , Rfl:   Allergies  Allergen Reactions  . Cephalosporins   . Ceftin [Cefuroxime Axetil] Rash     ROS  Constitutional: Negative for fever or weight change.  Respiratory: Negative for cough and shortness of breath.   Cardiovascular: Negative for chest pain or palpitations.  Gastrointestinal: Negative for abdominal pain, no bowel changes.  Musculoskeletal: Negative for gait problem or joint swelling.  Skin: Negative for rash.  Neurological: Negative for dizziness or headache.  No other specific complaints in a complete review of systems (except as listed in HPI above).  Objective  Vitals:   10/24/15 1442  BP: 128/82  Pulse: (!) 116  Resp: 16  Temp: 98.5 F (36.9 C)  SpO2: 98%  Weight: 242 lb 4 oz (109.9 kg)  Height: 5' (1.524 m)    Body mass index is 47.31  kg/m.  Physical Exam  Constitutional: Patient appears well-developed and well-nourished. Obese No distress.  HEENT: head atraumatic, normocephalic, pupils equal and reactive to light,  neck supple, throat within normal limits Cardiovascular: Normal rate, regular rhythm and normal heart sounds.  No murmur heard. No BLE edema. Pulmonary/Chest: Effort normal and breath sounds normal. No respiratory distress. Abdominal: Soft.  There is no tenderness. Psychiatric: Patient has a normal mood and affect. behavior is normal. Judgment and thought content normal.  PHQ2/9: Depression screen [facility] 2/9 10/24/2015 08/08/2015 05/18/2015 12/17/2014 10/14/2014  Decreased Interest 0 1 0 0 0  Down, Depressed, Hopeless 0 0 0 0 0  PHQ - 2 Score 0 1 0 0 0  Fall Risk: Fall Risk  10/24/2015 08/08/2015 05/18/2015 12/17/2014 10/14/2014  Falls in the past year? No No No No Yes  Number falls in past yr: - - - - 1  Injury with Fall? - - - - No      Functional Status Survey: Is the patient deaf or have difficulty hearing?: No Does the patient have difficulty seeing, even when wearing glasses/contacts?: No Does the patient have difficulty concentrating, remembering, or making decisions?: No Does the patient have difficulty walking or climbing stairs?: No Does the patient have difficulty dressing or bathing?: No Does the patient have difficulty doing errands alone such as visiting a doctor's office or shopping?: No    Assessment & Plan  1. Hypertension, benign  - telmisartan-hydrochlorothiazide (MICARDIS HCT) 80-25 MG tablet; Take 1 tablet by mouth daily.  Dispense: 90 tablet; Refill: 1  2. Need for influenza vaccination  - Flu Vaccine QUAD 36+ mos PF IM (Fluarix & Fluzone Quad PF)  3. Major depressive disorder with single episode, in partial remission (HCC)  - buPROPion (WELLBUTRIN XL) 300 MG 24 hr tablet; Take 1 tablet (300 mg total) by mouth daily.  Dispense: 90 tablet; Refill: 1 - sertraline (ZOLOFT)  100 MG tablet; Take 1 tablet (100 mg total) by mouth daily.  Dispense: 90 tablet; Refill: 1  4. Controlled insomnia  - temazepam (RESTORIL) 30 MG capsule; Take 1 capsule (30 mg total) by mouth at bedtime as needed for sleep.  Dispense: 90 capsule; Refill: 0  5. Vitamin D deficiency  Taking otc supplementation  6. Iron deficiency anemia due to chronic blood loss  - CBC with Differential/Platelet - Ferritin  7. History of gastric ulcer  Resolved , cause of anemia   8. Morbid obesity, unspecified obesity type ([place])  Discussed all optiosns for weight loss medications including Belviq, Qsymia, Saxenda and Contrave. Discussed risk and benefits of each of them. She chose Belviq for now, we will avoid Qsymia because of HTN or Contrave - already taking Wellbutrin. She denies risk of pancreatitis or family history of thyroid carcinoma - Lorcaserin HCl ER (BELVIQ XR) 20 MG TB24; Take 1 tablet by mouth daily.  Dispense: 30 tablet; Refill: 2

## 2015-10-25 LAB — CBC WITH DIFFERENTIAL/PLATELET
BASOS: 0 %
Basophils Absolute: 0 10*3/uL (ref 0.0–0.2)
EOS (ABSOLUTE): 0.1 10*3/uL (ref 0.0–0.4)
EOS: 1 %
HEMATOCRIT: 38.1 % (ref 34.0–46.6)
Hemoglobin: 12.2 g/dL (ref 11.1–15.9)
IMMATURE GRANS (ABS): 0 10*3/uL (ref 0.0–0.1)
IMMATURE GRANULOCYTES: 0 %
LYMPHS: 20 %
Lymphocytes Absolute: 1.7 10*3/uL (ref 0.7–3.1)
MCH: 26.4 pg — AB (ref 26.6–33.0)
MCHC: 32 g/dL (ref 31.5–35.7)
MCV: 83 fL (ref 79–97)
MONOS ABS: 0.6 10*3/uL (ref 0.1–0.9)
Monocytes: 7 %
NEUTROS ABS: 6.2 10*3/uL (ref 1.4–7.0)
NEUTROS PCT: 72 %
Platelets: 443 10*3/uL — ABNORMAL HIGH (ref 150–379)
RBC: 4.62 x10E6/uL (ref 3.77–5.28)
RDW: 13.8 % (ref 12.3–15.4)
WBC: 8.7 10*3/uL (ref 3.4–10.8)

## 2015-10-25 LAB — FERRITIN: Ferritin: 24 ng/mL (ref 15–150)

## 2015-10-28 ENCOUNTER — Telehealth: Payer: Self-pay

## 2015-10-28 NOTE — Telephone Encounter (Signed)
Pt is notified about lab results.

## 2016-01-23 ENCOUNTER — Encounter: Payer: Self-pay | Admitting: Family Medicine

## 2016-01-23 ENCOUNTER — Ambulatory Visit (INDEPENDENT_AMBULATORY_CARE_PROVIDER_SITE_OTHER): Payer: 59 | Admitting: Family Medicine

## 2016-01-23 VITALS — BP 132/82 | HR 107 | Temp 98.4°F | Resp 16 | Ht 60.0 in | Wt 238.1 lb

## 2016-01-23 DIAGNOSIS — I10 Essential (primary) hypertension: Secondary | ICD-10-CM

## 2016-01-23 DIAGNOSIS — Z862 Personal history of diseases of the blood and blood-forming organs and certain disorders involving the immune mechanism: Secondary | ICD-10-CM

## 2016-01-23 DIAGNOSIS — G4700 Insomnia, unspecified: Secondary | ICD-10-CM | POA: Diagnosis not present

## 2016-01-23 DIAGNOSIS — I517 Cardiomegaly: Secondary | ICD-10-CM | POA: Diagnosis not present

## 2016-01-23 DIAGNOSIS — F324 Major depressive disorder, single episode, in partial remission: Secondary | ICD-10-CM

## 2016-01-23 MED ORDER — BUPROPION HCL ER (XL) 300 MG PO TB24: 300.0000 mg | ORAL_TABLET | Freq: Every day | ORAL | 1 refills | Status: DC

## 2016-01-23 MED ORDER — SERTRALINE HCL 100 MG PO TABS: 100.0000 mg | ORAL_TABLET | Freq: Every day | ORAL | 1 refills | Status: DC

## 2016-01-23 MED ORDER — TELMISARTAN-HCTZ 80-25 MG PO TABS: 1.0000 | ORAL_TABLET | Freq: Every day | ORAL | 1 refills | Status: DC

## 2016-01-23 MED ORDER — TEMAZEPAM 30 MG PO CAPS: 30.0000 mg | ORAL_CAPSULE | Freq: Every evening | ORAL | 0 refills | Status: DC | PRN

## 2016-01-23 NOTE — Progress Notes (Signed)
Name: Brooke Page   MRN: [id number]    DOB: [birth year]   Date:01/23/2016       Progress Note  Subjective  Chief Complaint  Chief Complaint  Patient presents with  . Hypertension    3 month follow up hasbeen having headaches but not related to BP  . Depression    no issues taking medication  . Obesity    insurance would no longer cover belviq    HPI  HTN: she is back on bp medication, no side effects. She denieschest pain, edema, palpitation, or SOB. BP at home has been at goal. Usually 120's/80's  Major Depression Mild : still grieving the loss of her brother,she is doing well, anniversary of his death was 22-Oct-2015 and she did very well ( 3 years ago ) She has been on Zoloft for many years ( started with the birth of twins ), but we adding Wellbutrin Summer 2017.  No longer having crying spells. She is very stressed/overwhelmed at work - one co-worker was fired and another one retired. The work volume increased since March 2017 and she is now able to handle the stress. Denies suicidal thoughts or ideation.   Insomnia: taking Temazepam and is helping her fall and stay asleep, no side effects  Morbid obesity: she has been obese since her teens, but worse after the first child was born 87 years ago her weight was 200 lbs, she lost some weight, but after the birth of her daughter she went down to 220 lbs, after the twins her weight was up to 286 lbs. She lost weight after she nursed the twins, and went down to 220 lbs.  We gave her Belviq rx Fall of 2017 and it curbed her appetite, she took it for 2 months and weight is down, however no longer covered by insurance. She has been cooking healthier meals at home, eating more salads and vegetables. Doing well, lost weight through the holidays.   Left ventricular hypertrophy: she is doing well, never saw cardiologist and we will make a referral   History iron deficiency anemia: she had blood transfusion last year, likely from  gastric ulcer, no stomach pain or blood in stools. Feeling well , no pica or fatigue   Patient Active Problem List   Diagnosis Date Noted  . Obesity, morbid ([place]) 08/08/2015  . Ventricular hypertrophy determined by echocardiography 05/18/2015  . History of trichomonal vaginitis 12/24/2014  . Family history of colon cancer 07/13/2014  . Family history of breast cancer 07/13/2014  . Mitral regurgitation 07/11/2014  . Hiatal hernia without gangrene and obstruction 07/11/2014  . Seasonal allergic rhinitis 07/11/2014  . History of pre-eclampsia 07/11/2014  . Type A blood, Rh positive 07/11/2014  . Intramural leiomyoma of uterus 07/11/2014  . History of cervical dysplasia 07/11/2014  . Major depression in partial remission ([place]) 07/11/2014  . Controlled insomnia 07/11/2014  . Menorrhagia with regular cycle 07/11/2014  . Obesity (BMI 30.0-34.9) 07/11/2014  . History of iron deficiency anemia 07/11/2014  . Vitamin D deficiency 07/11/2014  . Hypertension, benign 07/11/2014  . History of blood transfusion 07/11/2014  . History of pulmonary embolism 05/08/2013  . History of DVT (deep vein thrombosis) 05/08/2013    Past Surgical History:  Procedure Laterality Date  . ABDOMINAL HYSTERECTOMY    . CESAREAN SECTION    . ENDOMETRIAL BIOPSY  05/27/13   no hyperplasia or carcionam, weakly proliferative endometrium,stomal and or glandular breakdown- [facility]  . LEEP  2000  .  PERIPHERAL VASCULAR CATHETERIZATION N/A 05/14/2014   Procedure: ivc filter removal;  Surgeon: Katha Cabal, MD;  Location: [place] [facility];  Service: Cardiovascular;  Laterality: N/A;  . TONSILLECTOMY    . VENOGRAM N/A 05/14/2014   Procedure: Venogram;  Surgeon: Katha Cabal, MD;  Location: [place] [facility];  Service: Cardiovascular;  Laterality: N/A;  IVC    Family History  Problem Relation Age of Onset  . Cancer Mother     Colon and Breast  . Hypertension Mother   . Breast cancer Mother 43  . Diabetes  Father   . Hypertension Father   . Diabetes Brother   . Pulmonary embolism Brother   . Obesity Brother   . Asthma Son     Social History   Social History  . Marital status: Married    Spouse name: N/A  . Number of children: N/A  . Years of education: N/A   Occupational History  . Not on file.   Social History Main Topics  . Smoking status: Never Smoker  . Smokeless tobacco: Never Used  . Alcohol use 0.0 oz/week     Comment: Occasional  . Drug use: No  . Sexual activity: Yes    Partners: Male    Birth control/ protection: None     Comment: Tubligation   Other Topics Concern  . Not on file   Social History Narrative  . No narrative on file     Current Outpatient Prescriptions:  .  buPROPion (WELLBUTRIN XL) 300 MG 24 hr tablet, Take 1 tablet (300 mg total) by mouth daily., Disp: 90 tablet, Rfl: 1 .  cholecalciferol (VITAMIN D) 1000 UNITS tablet, Take 1,000 Units by mouth daily., Disp: , Rfl:  .  ferrous sulfate 325 (65 FE) MG tablet, Take 1 tablet by mouth daily., Disp: , Rfl:  .  fluticasone (FLONASE) 50 MCG/ACT nasal spray, Place 2 sprays into both nostrils daily., Disp: 42 g, Rfl: 0 .  loratadine (CLARITIN) 10 MG tablet, Take 1 tablet by mouth as needed., Disp: , Rfl:  .  sertraline (ZOLOFT) 100 MG tablet, Take 1 tablet (100 mg total) by mouth daily., Disp: 90 tablet, Rfl: 1 .  telmisartan-hydrochlorothiazide (MICARDIS HCT) 80-25 MG tablet, Take 1 tablet by mouth daily., Disp: 90 tablet, Rfl: 1 .  temazepam (RESTORIL) 30 MG capsule, Take 1 capsule (30 mg total) by mouth at bedtime as needed for sleep., Disp: 90 capsule, Rfl: 0  Allergies  Allergen Reactions  . Cephalosporins   . Ceftin [Cefuroxime Axetil] Rash     ROS  Constitutional: Negative for fever, positive for mild  weight change.  Respiratory: Negative for cough and shortness of breath.   Cardiovascular: Negative for chest pain or palpitations.  Gastrointestinal: Negative for abdominal pain, no  bowel changes.  Musculoskeletal: Negative for gait problem or joint swelling.  Skin: Negative for rash.  Neurological: Negative for dizziness or headache.  No other specific complaints in a complete review of systems (except as listed in HPI above).  Objective  Vitals:   01/23/16 1424  BP: 132/82  Pulse: (!) 107  Resp: 16  Temp: 98.4 F (36.9 C)  SpO2: 97%  Weight: 238 lb 2 oz (108 kg)  Height: 5' (1.524 m)    Body mass index is 46.51 kg/m.  Physical Exam  Constitutional: Patient appears well-developed and well-nourished. Obese  No distress.  HEENT: head atraumatic, normocephalic, pupils equal and reactive to light, , neck supple, throat within normal limits Cardiovascular:  Normal rate, regular rhythm and normal heart sounds.  No murmur heard. No BLE edema. Pulmonary/Chest: Effort normal and breath sounds normal. No respiratory distress. Abdominal: Soft.  There is no tenderness. Psychiatric: Patient has a normal mood and affect. behavior is normal. Judgment and thought content normal.  PHQ2/9: Depression screen [facility] 2/9 01/23/2016 10/24/2015 08/08/2015 05/18/2015 12/17/2014  Decreased Interest 0 0 1 0 0  Down, Depressed, Hopeless 0 0 0 0 0  PHQ - 2 Score 0 0 1 0 0     Fall Risk: Fall Risk  01/23/2016 10/24/2015 08/08/2015 05/18/2015 12/17/2014  Falls in the past year? No No No No No  Number falls in past yr: - - - - -  Injury with Fall? - - - - -      Assessment & Plan  1. Hypertension, benign  - telmisartan-hydrochlorothiazide (MICARDIS HCT) 80-25 MG tablet; Take 1 tablet by mouth daily.  Dispense: 90 tablet; Refill: 1  2. Major depressive disorder with single episode, in partial remission (HCC)  - buPROPion (WELLBUTRIN XL) 300 MG 24 hr tablet; Take 1 tablet (300 mg total) by mouth daily.  Dispense: 90 tablet; Refill: 1 - sertraline (ZOLOFT) 100 MG tablet; Take 1 tablet (100 mg total) by mouth daily.  Dispense: 90 tablet; Refill: 1  3. Controlled insomnia  -  temazepam (RESTORIL) 30 MG capsule; Take 1 capsule (30 mg total) by mouth at bedtime as needed for sleep.  Dispense: 90 capsule; Refill: 0   4. Ventricular hypertrophy determined by echocardiography  Discussed sleep study, we will refer her to cardiologist for further evaluation, history of PE in 2015 when echo was done, never seen by cardiologist   - Ambulatory referral to Cardiology  5. Morbid obesity, unspecified obesity type ([place])  - Insulin, 2 Hour  6. History of iron deficiency anemia  - CBC with Differential/Platelet - Iron and TIBC - Ferritin

## 2016-01-24 LAB — CBC WITH DIFFERENTIAL/PLATELET
BASOS ABS: 0 10*3/uL (ref 0.0–0.2)
Basos: 0 %
EOS (ABSOLUTE): 0.1 10*3/uL (ref 0.0–0.4)
Eos: 1 %
Hematocrit: 39.7 % (ref 34.0–46.6)
Hemoglobin: 12.7 g/dL (ref 11.1–15.9)
Immature Grans (Abs): 0 10*3/uL (ref 0.0–0.1)
Immature Granulocytes: 0 %
LYMPHS ABS: 1.9 10*3/uL (ref 0.7–3.1)
Lymphs: 21 %
MCH: 26.3 pg — ABNORMAL LOW (ref 26.6–33.0)
MCHC: 32 g/dL (ref 31.5–35.7)
MCV: 82 fL (ref 79–97)
MONOS ABS: 0.5 10*3/uL (ref 0.1–0.9)
Monocytes: 6 %
Neutrophils Absolute: 6.4 10*3/uL (ref 1.4–7.0)
Neutrophils: 72 %
Platelets: 420 10*3/uL — ABNORMAL HIGH (ref 150–379)
RBC: 4.82 x10E6/uL (ref 3.77–5.28)
RDW: 15.2 % (ref 12.3–15.4)
WBC: 8.9 10*3/uL (ref 3.4–10.8)

## 2016-01-24 LAB — FERRITIN: Ferritin: 27 ng/mL (ref 15–150)

## 2016-01-24 LAB — IRON AND TIBC
Iron Saturation: 33 % (ref 15–55)
Iron: 134 ug/dL (ref 27–159)
TIBC: 404 ug/dL (ref 250–450)
UIBC: 270 ug/dL (ref 131–425)

## 2016-01-31 ENCOUNTER — Ambulatory Visit (INDEPENDENT_AMBULATORY_CARE_PROVIDER_SITE_OTHER): Payer: 59

## 2016-01-31 ENCOUNTER — Encounter: Payer: Self-pay | Admitting: Cardiology

## 2016-01-31 ENCOUNTER — Ambulatory Visit (INDEPENDENT_AMBULATORY_CARE_PROVIDER_SITE_OTHER): Payer: 59 | Admitting: Cardiology

## 2016-01-31 ENCOUNTER — Other Ambulatory Visit: Payer: Self-pay

## 2016-01-31 VITALS — BP 130/90 | HR 93 | Ht 60.0 in | Wt 242.0 lb

## 2016-01-31 DIAGNOSIS — Z6841 Body Mass Index (BMI) 40.0 and over, adult: Secondary | ICD-10-CM

## 2016-01-31 DIAGNOSIS — IMO0001 Reserved for inherently not codable concepts without codable children: Secondary | ICD-10-CM

## 2016-01-31 DIAGNOSIS — I10 Essential (primary) hypertension: Secondary | ICD-10-CM

## 2016-01-31 DIAGNOSIS — E6609 Other obesity due to excess calories: Secondary | ICD-10-CM

## 2016-01-31 DIAGNOSIS — I517 Cardiomegaly: Secondary | ICD-10-CM | POA: Diagnosis not present

## 2016-01-31 LAB — ECHOCARDIOGRAM COMPLETE
HEIGHTINCHES: 60 in
Weight: 3872 oz

## 2016-01-31 NOTE — Progress Notes (Signed)
Cardiology Office Note   Date:  01/31/2016   ID:  KYMANI FOULKES, DOB 25-Apr-1970, MRN [id number]  Referring Doctor:  Loistine Chance, MD   Cardiologist:   Wende Bushy, MD   Reason for consultation:  Chief Complaint  Patient presents with  . New Patient (Initial Visit)    no complaints.   Consult for history of left ventricular hypertrophy on echo   History of Present Illness: Brooke Page is a 46 y.o. female who presents for evaluation for left ventricular hypertrophy noted on echocardiogram  Patient was admitted 2015 for DVT and PE. Echo was done as part of the workup. She was noted to have moderate LVH at that time. She has not seen a cardiologist since that time.  Patient reports that her blood pressure control has improved. It was much higher during the time that she was admitted for PE. At home, her blood pressures in the 130s or less over 80s or less.  Patient denies chest pain, shortness of breath, PND, orthopnea, edema, palpitations, loss of consciousness. No fever, cough, colds, abdominal pain. No bleeding.   ROS:  Please see the history of present illness. Aside from mentioned under HPI, all other systems are reviewed and negative.     Past Medical History:  Diagnosis Date  . Allergy   . Depression   . DVT (deep venous thrombosis) ([place])   . GERD (gastroesophageal reflux disease)   . History of hiatal hernia   . Hypertension   . Previous cesarean section   . Pulmonary emboli [facility])     Past Surgical History:  Procedure Laterality Date  . ABDOMINAL HYSTERECTOMY    . CESAREAN SECTION    . ENDOMETRIAL BIOPSY  05/27/13   no hyperplasia or carcionam, weakly proliferative endometrium,stomal and or glandular breakdown- [facility]  . LEEP  2000  . PERIPHERAL VASCULAR CATHETERIZATION N/A 05/14/2014   Procedure: ivc filter removal;  Surgeon: Katha Cabal, MD;  Location: [place] [facility];  Service: Cardiovascular;  Laterality: N/A;  . TONSILLECTOMY     . VENOGRAM N/A 05/14/2014   Procedure: Venogram;  Surgeon: Katha Cabal, MD;  Location: [place] [facility];  Service: Cardiovascular;  Laterality: N/A;  IVC     reports that she has never smoked. She has never used smokeless tobacco. She reports that she drinks alcohol. She reports that she does not use drugs.   family history includes Asthma in her son; Breast cancer (age of onset: 26) in her mother; Cancer in her mother; Diabetes in her brother and father; Hypertension in her father and mother; Obesity in her brother; Pulmonary embolism in her brother.   Outpatient Medications Prior to Visit  Medication Sig Dispense Refill  . buPROPion (WELLBUTRIN XL) 300 MG 24 hr tablet Take 1 tablet (300 mg total) by mouth daily. 90 tablet 1  . cholecalciferol (VITAMIN D) 1000 UNITS tablet Take 1,000 Units by mouth daily.    . ferrous sulfate 325 (65 FE) MG tablet Take 1 tablet by mouth daily.    . fluticasone (FLONASE) 50 MCG/ACT nasal spray Place 2 sprays into both nostrils daily. 42 g 0  . loratadine (CLARITIN) 10 MG tablet Take 1 tablet by mouth as needed.    . sertraline (ZOLOFT) 100 MG tablet Take 1 tablet (100 mg total) by mouth daily. 90 tablet 1  . telmisartan-hydrochlorothiazide (MICARDIS HCT) 80-25 MG tablet Take 1 tablet by mouth daily. 90 tablet 1  . temazepam (RESTORIL) 30 MG  capsule Take 1 capsule (30 mg total) by mouth at bedtime as needed for sleep. 90 capsule 0   No facility-administered medications prior to visit.      Allergies: Cephalosporins and Ceftin [cefuroxime axetil]    PHYSICAL EXAM: VS:  BP 130/90 (BP Location: Left Arm, Patient Position: Sitting, Cuff Size: Large)   Pulse 93   Ht 5' (1.524 m)   Wt 242 lb (109.8 kg)   BMI 47.26 kg/m  , Body mass index is 47.26 kg/m. Wt Readings from Last 3 Encounters:  01/31/16 242 lb (109.8 kg)  01/23/16 238 lb 2 oz (108 kg)  10/24/15 242 lb 4 oz (109.9 kg)    GENERAL:  well developed, well nourished, Morbidly obese, not  in acute distress HEENT: normocephalic, pink conjunctivae, anicteric sclerae, no xanthelasma, normal dentition, oropharynx clear NECK:  no neck vein engorgement, JVP normal, no hepatojugular reflux, carotid upstroke brisk and symmetric, no bruit, no thyromegaly, no lymphadenopathy LUNGS:  good respiratory effort, clear to auscultation bilaterally CV:  PMI not displaced, no thrills, no lifts, S1 and S2 within normal limits, no palpable S3 or S4, no murmurs, no rubs, no gallops ABD:  Soft, nontender, nondistended, normoactive bowel sounds, no abdominal aortic bruit, no hepatomegaly, no splenomegaly MS: nontender back, no kyphosis, no scoliosis, no joint deformities EXT:  2+ DP/PT pulses, no edema, no varicosities, no cyanosis, no clubbing SKIN: warm, nondiaphoretic, normal turgor, no ulcers NEUROPSYCH: alert, oriented to person, place, and time, sensory/motor grossly intact, normal mood, appropriate affect  Recent Labs: 05/18/2015: ALT 8; BUN 10; Creatinine, Ser 0.73; Potassium 3.9; Sodium 137; TSH 1.970 01/23/2016: Platelets 420   Lipid Panel    Component Value Date/Time   CHOL 164 08/25/2014   CHOL 166 08/13/2014 0953   TRIG 115 08/25/2014   HDL 62 08/25/2014   HDL 62 08/13/2014 0953   CHOLHDL 2.7 08/13/2014 0953   LDLCALC 79 08/25/2014   LDLCALC 89 08/13/2014 0953     Other studies Reviewed:  EKG:  The ekg from 01/31/2016 was personally reviewed by me and it revealed sinus rhythm, 93 BPM.  Additional studies/ records that were reviewed personally reviewed by me today include:  Echo 05/13/2013:  1. Left ventricular ejection fraction, by visual estimation, is 55 to  60%.  2. Normal global left ventricular systolic function.  3. Moderate concentric left ventricular hypertrophy.  4. Moderately increased left ventricular septal thickness.  5. Mildly dilated left atrium.  6. Mild mitral valve regurgitation.  7. Moderately increased left ventricular posterior wall  thickness  ASSESSMENT AND PLAN: History of left ventricular hypertrophy noted on echocardiogram  most likely related to history of hypertension, likely uncontrolled in the past  Recommend echocardiogram for reevaluation. Continue management of hypertension. Agree with telmisartan HCTZ. BP is well controlled. Continue monitoring BP. Continue current medical therapy and lifestyle changes.  Obesity Body mass index is 47.26 kg/m.Marland Kitchen Recommend aggressive weight loss through diet and increased physical activity.   Current medicines are reviewed at length with the patient today.  The patient does not have concerns regarding medicines.  Labs/ tests ordered today include:  Orders Placed This Encounter  Procedures  . EKG 12-Lead  . ECHOCARDIOGRAM COMPLETE    I had a lengthy and detailed discussion with the patient regarding diagnoses, prognosis, diagnostic options, treatment options, and side effects of medications.   I counseled the patient on importance of lifestyle modification including heart healthy diet, regular physical activity .   Disposition:   FU with undersigned after  tests prn  Thank you for this consultation. We will forwarding this consultation to referring physician.   Signed, Wende Bushy, MD  01/31/2016 10:24 AM    [place]  This note was generated in part with voice recognition software and I apologize for any typographical errors that were not detected and corrected.

## 2016-01-31 NOTE — Progress Notes (Deleted)
Cardiology Office Note   Date:  01/31/2016   ID:  Brooke Page, DOB 1970/03/17, MRN [id number]  Referring Doctor:  Loistine Chance, MD   Cardiologist:   Wende Bushy, MD   Reason for consultation:  Chief Complaint  Patient presents with  . New Patient (Initial Visit)    no complaints.      History of Present Illness: Brooke Page is a 46 y.o. female who presents for ***   ROS:  Please see the history of present illness. Aside from mentioned under HPI, all other systems are reviewed and negative.     Past Medical History:  Diagnosis Date  . Allergy   . Depression   . DVT (deep venous thrombosis) ([place])   . GERD (gastroesophageal reflux disease)   . History of hiatal hernia   . Hypertension   . Previous cesarean section   . Pulmonary emboli [facility]     Past Surgical History:  Procedure Laterality Date  . ABDOMINAL HYSTERECTOMY    . CESAREAN SECTION    . ENDOMETRIAL BIOPSY  05/27/13   no hyperplasia or carcionam, weakly proliferative endometrium,stomal and or glandular breakdown- [facility]  . LEEP  2000  . PERIPHERAL VASCULAR CATHETERIZATION N/A 05/14/2014   Procedure: ivc filter removal;  Surgeon: Katha Cabal, MD;  Location: [place] [facility];  Service: Cardiovascular;  Laterality: N/A;  . TONSILLECTOMY    . VENOGRAM N/A 05/14/2014   Procedure: Venogram;  Surgeon: Katha Cabal, MD;  Location: [place] [facility];  Service: Cardiovascular;  Laterality: N/A;  IVC     reports that she has never smoked. She has never used smokeless tobacco. She reports that she drinks alcohol. She reports that she does not use drugs.   family history includes Asthma in her son; Breast cancer (age of onset: 46) in her mother; Cancer in her mother; Diabetes in her brother and father; Hypertension in her father and mother; Obesity in her brother; Pulmonary embolism in her brother.   Outpatient Medications Prior to Visit  Medication Sig Dispense Refill  . buPROPion  (WELLBUTRIN XL) 300 MG 24 hr tablet Take 1 tablet (300 mg total) by mouth daily. 90 tablet 1  . cholecalciferol (VITAMIN D) 1000 UNITS tablet Take 1,000 Units by mouth daily.    . ferrous sulfate 325 (65 FE) MG tablet Take 1 tablet by mouth daily.    . fluticasone (FLONASE) 50 MCG/ACT nasal spray Place 2 sprays into both nostrils daily. 42 g 0  . loratadine (CLARITIN) 10 MG tablet Take 1 tablet by mouth as needed.    . sertraline (ZOLOFT) 100 MG tablet Take 1 tablet (100 mg total) by mouth daily. 90 tablet 1  . telmisartan-hydrochlorothiazide (MICARDIS HCT) 80-25 MG tablet Take 1 tablet by mouth daily. 90 tablet 1  . temazepam (RESTORIL) 30 MG capsule Take 1 capsule (30 mg total) by mouth at bedtime as needed for sleep. 90 capsule 0   No facility-administered medications prior to visit.      Allergies: Cephalosporins and Ceftin [cefuroxime axetil]    PHYSICAL EXAM: VS:  BP 130/90 (BP Location: Left Arm, Patient Position: Sitting, Cuff Size: Large)   Pulse 93   Ht 5' (1.524 m)   Wt 242 lb (109.8 kg)   BMI 47.26 kg/m  , Body mass index is 47.26 kg/m. Wt Readings from Last 3 Encounters:  01/31/16 242 lb (109.8 kg)  01/23/16 238 lb 2 oz (108 kg)  10/24/15 242 lb 4  oz (109.9 kg)    GENERAL:  well developed, well nourished, *** obese, not in acute distress HEENT: normocephalic, pink conjunctivae, anicteric sclerae, no xanthelasma, normal dentition, oropharynx clear NECK:  no neck vein engorgement, JVP normal, no hepatojugular reflux, carotid upstroke brisk and symmetric, no bruit, no thyromegaly, no lymphadenopathy LUNGS:  good respiratory effort, clear to auscultation bilaterally CV:  PMI not displaced, no thrills, no lifts, S1 and S2 within normal limits, no palpable S3 or S4, no murmurs, no rubs, no gallops ABD:  Soft, nontender, nondistended, normoactive bowel sounds, no abdominal aortic bruit, no hepatomegaly, no splenomegaly MS: nontender back, no kyphosis, no scoliosis, no joint  deformities EXT:  2+ DP/PT pulses, no edema, no varicosities, no cyanosis, no clubbing SKIN: warm, nondiaphoretic, normal turgor, no ulcers NEUROPSYCH: alert, oriented to person, place, and time, sensory/motor grossly intact, normal mood, appropriate affect  Recent Labs: 05/18/2015: ALT 8; BUN 10; Creatinine, Ser 0.73; Potassium 3.9; Sodium 137; TSH 1.970 01/23/2016: Platelets 420   Lipid Panel    Component Value Date/Time   CHOL 164 08/25/2014   CHOL 166 08/13/2014 0953   TRIG 115 08/25/2014   HDL 62 08/25/2014   HDL 62 08/13/2014 0953   CHOLHDL 2.7 08/13/2014 0953   LDLCALC 79 08/25/2014   LDLCALC 89 08/13/2014 0953     Other studies Reviewed:  EKG:  The ekg from *** was personally reviewed by me and it revealed ***  Additional studies/ records that were reviewed personally reviewed by me today include: ***   ASSESSMENT AND PLAN:    Current medicines are reviewed at length with the patient today.  The patient {ACTIONS; HAS/DOES NOT HAVE:19233} concerns regarding medicines.  Labs/ tests ordered today include: No orders of the defined types were placed in this encounter.   I had a lengthy and detailed discussion with the patient regarding diagnoses, prognosis, diagnostic options, treatment options ***, and side effects of medications.   I counseled the patient on importance of lifestyle modification including heart healthy diet, regular physical activity *** , and smoking cessation.   Disposition:   FU with undersigned after tests ***  Thank you for this consultation. We will forwarding this consultation to referring physician.   Signed, Wende Bushy, MD  01/31/2016 9:10 AM    [place]  This note was generated in part with voice recognition software and I apologize for any typographical errors that were not detected and corrected.

## 2016-01-31 NOTE — Patient Instructions (Addendum)
Testing/Procedures: Your physician has requested that you have an echocardiogram. Echocardiography is a painless test that uses sound waves to create images of your heart. It provides your doctor with information about the size and shape of your heart and how well your heart's chambers and valves are working. This procedure takes approximately one hour. There are no restrictions for this procedure.    Follow-Up: Your physician recommends that you schedule a follow-up appointment as needed with Dr. Ingal. We will call you with results and if needed schedule follow up at that time.   It was a pleasure seeing you today here in the office. Please do not hesitate to give us a call back if you have any further questions. [phone]  Abbee Cremeens A. RN, BSN     Echocardiogram An echocardiogram, or echocardiography, uses sound waves (ultrasound) to produce an image of your heart. The echocardiogram is simple, painless, obtained within a short period of time, and offers valuable information to your health care provider. The images from an echocardiogram can provide information such as:  Evidence of coronary artery disease (CAD).  Heart size.  Heart muscle function.  Heart valve function.  Aneurysm detection.  Evidence of a past heart attack.  Fluid buildup around the heart.  Heart muscle thickening.  Assess heart valve function. Tell a health care provider about:  Any allergies you have.  All medicines you are taking, including vitamins, herbs, eye drops, creams, and over-the-counter medicines.  Any problems you or family members have had with anesthetic medicines.  Any blood disorders you have.  Any surgeries you have had.  Any medical conditions you have.  Whether you are pregnant or may be pregnant. What happens before the procedure? No special preparation is needed. Eat and drink normally. What happens during the procedure?  In order to produce an image of your heart, gel  will be applied to your chest and a wand-like tool (transducer) will be moved over your chest. The gel will help transmit the sound waves from the transducer. The sound waves will harmlessly bounce off your heart to allow the heart images to be captured in real-time motion. These images will then be recorded.  You may need an IV to receive a medicine that improves the quality of the pictures. What happens after the procedure? You may return to your normal schedule including diet, activities, and medicines, unless your health care provider tells you otherwise. This information is not intended to replace advice given to you by your health care provider. Make sure you discuss any questions you have with your health care provider. Document Released: 12/23/1999 Document Revised: 08/13/2015 Document Reviewed: 09/01/2012 Elsevier Interactive Patient Education  2017 [employer]  

## 2016-02-16 IMAGING — CT CT ANGIO CHEST
2 of 6 series · 18 of 36 positions shown · IV contrast (APPLIED)
Comparison: Lower extremity ultrasound from the same day.

CLINICAL DATA: Cough, dyspnea, tachycardia with positive DVT.

EXAM:
CT ANGIOGRAPHY CHEST WITH CONTRAST
TECHNIQUE: Multidetector CT imaging of the chest was performed using the
standard protocol during bolus administration of intravenous
contrast. Multiplanar CT image reconstructions and MIPs were
obtained to evaluate the vascular anatomy.
CONTRAST:  75 cc of Isovue 370

[Series 6: pe 1.0 thins · axial · 0.73mm/px · z∈[-600,-340]mm · 17 of 295 slices shown]
[im 17/295  lung]
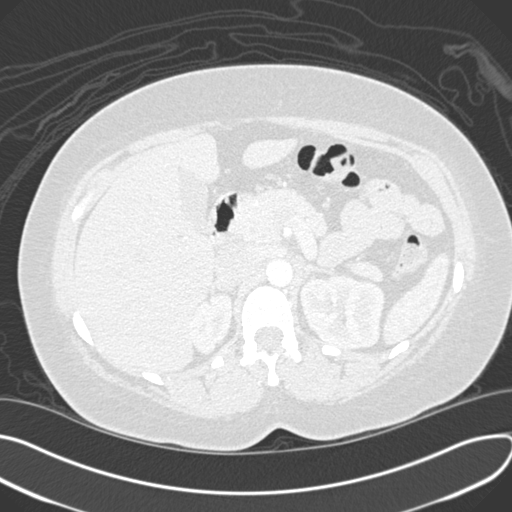
[im 33/295  mediastinal]
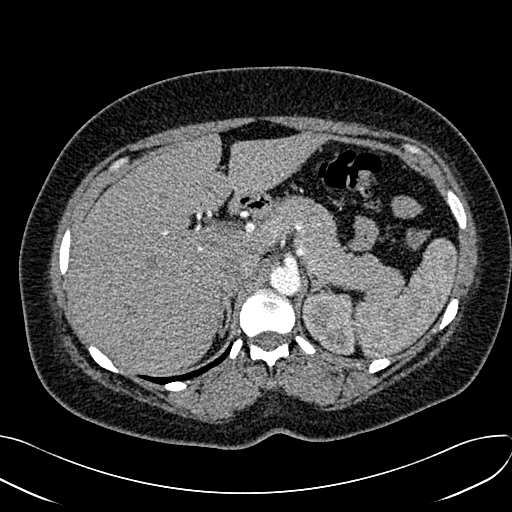
[im 50/295  lung]
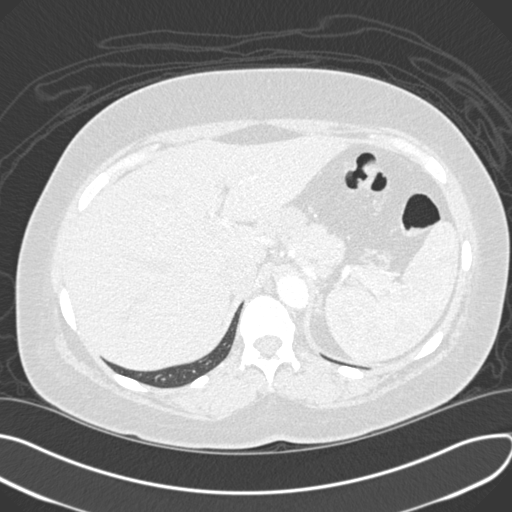
[im 66/295  mediastinal]
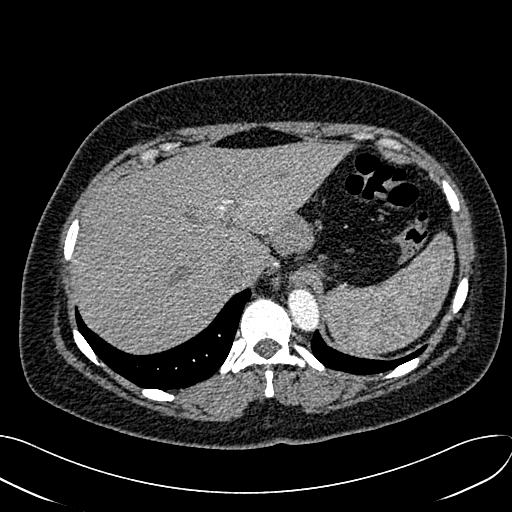
[im 82/295  lung]
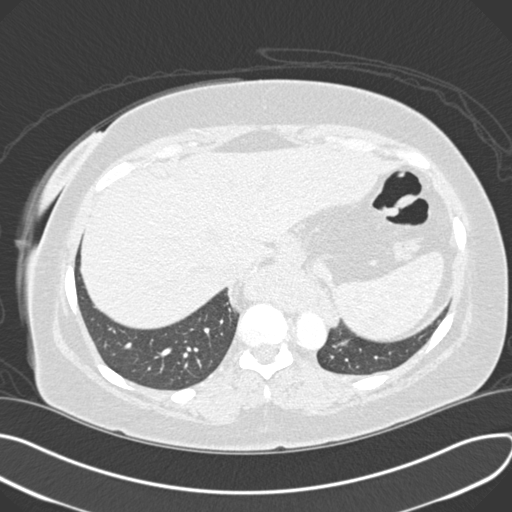
[im 99/295  mediastinal]
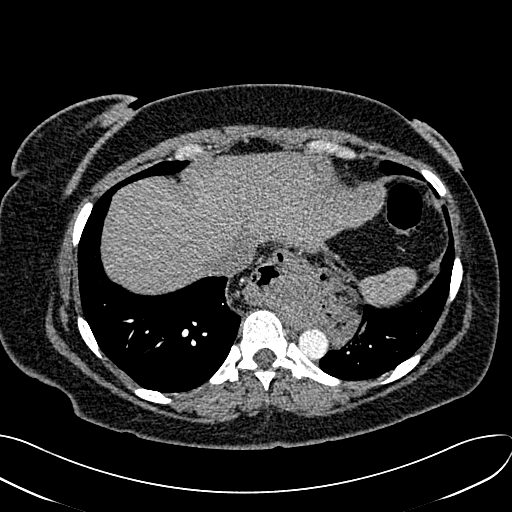
[im 115/295  lung]
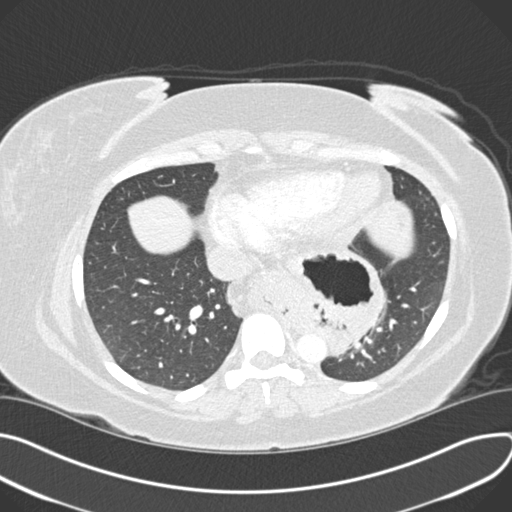
[im 131/295  mediastinal]
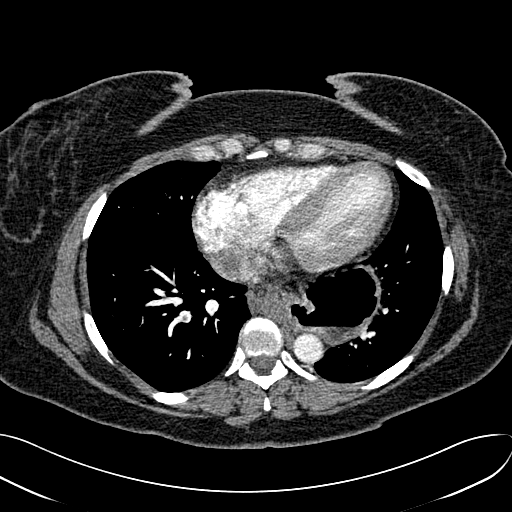
[im 148/295  lung]
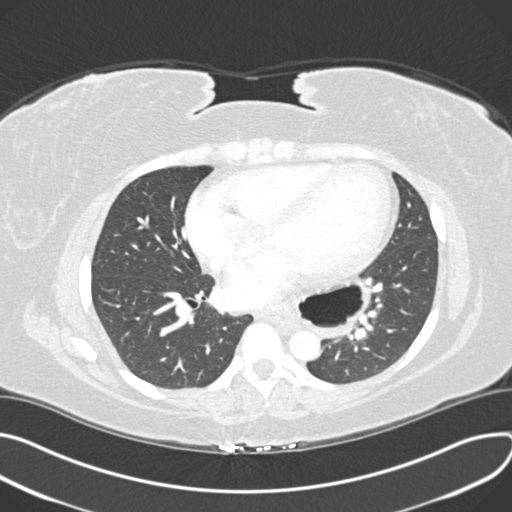
[im 164/295  mediastinal]
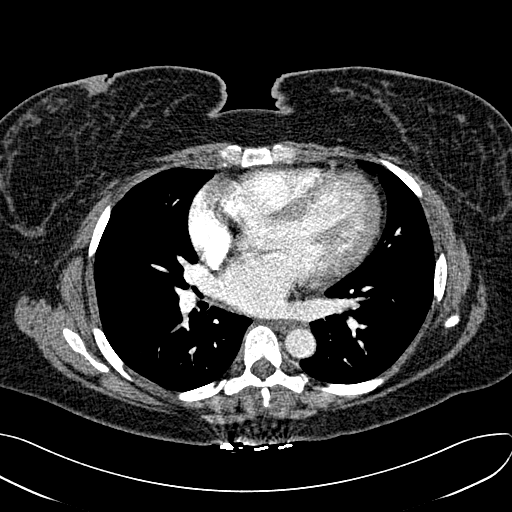
[im 180/295  lung]
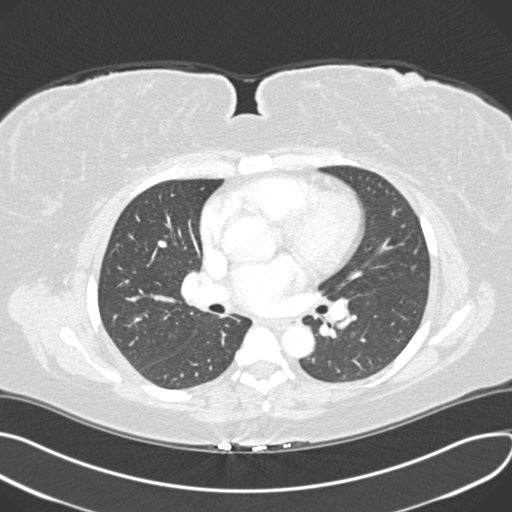
[im 197/295  mediastinal]
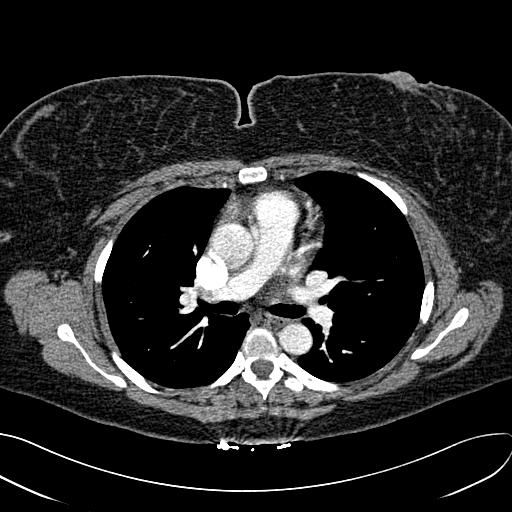
[im 213/295  lung]
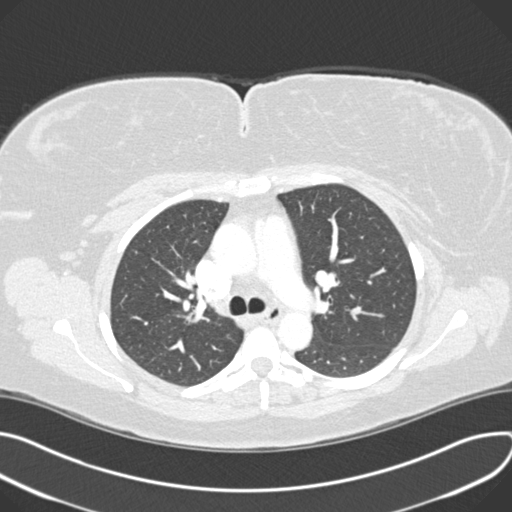
[im 229/295  mediastinal]
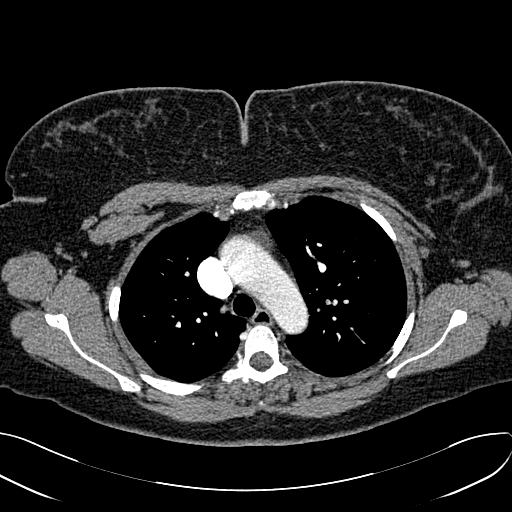
[im 245/295  lung]
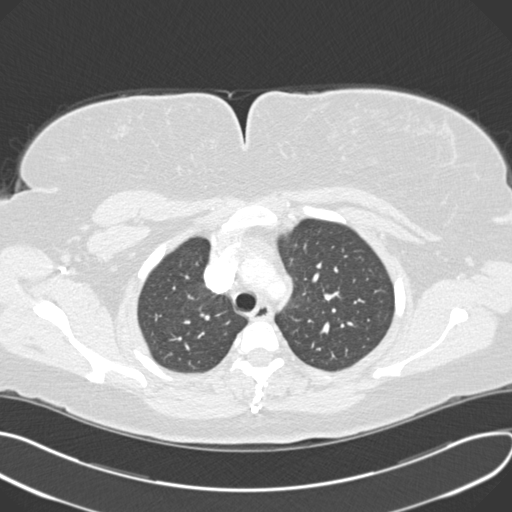
[im 262/295  mediastinal]
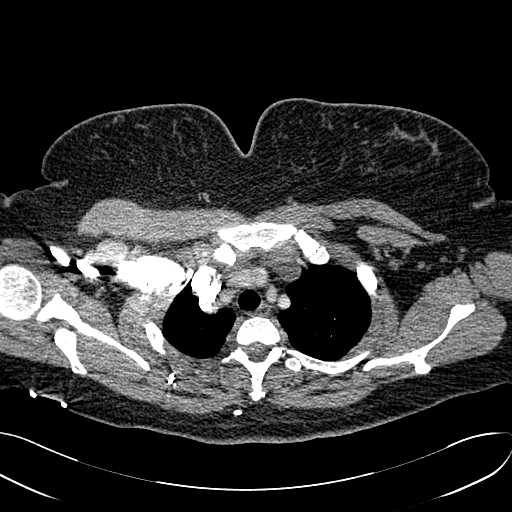
[im 278/295  lung]
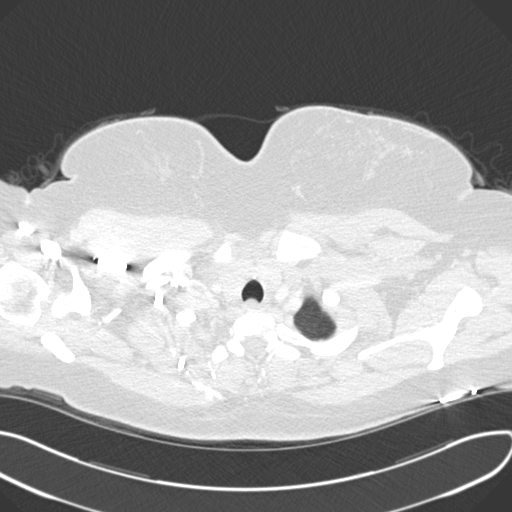

[Series 8: cor pe 2.0 mpr · coronal · 0.58mm/px · 1 of 135 slices shown]
[im 68/135  mediastinal]
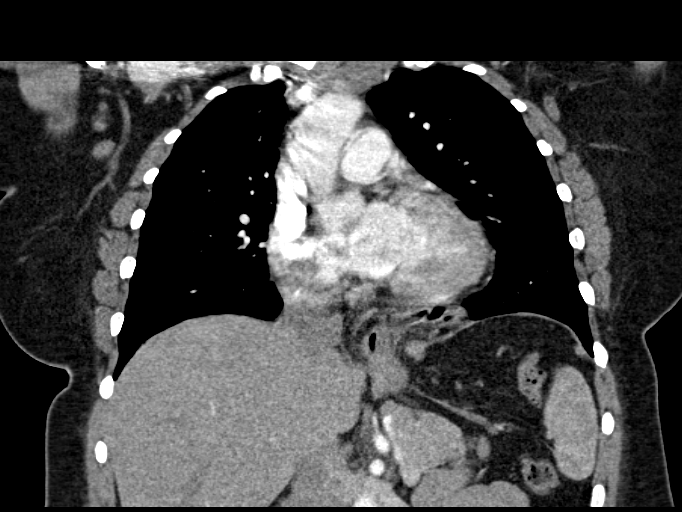

[18 of 36 positions shown; findings below may reference images not displayed]

FINDINGS: Visualized thyroid is unremarkable. No pathologically enlarged
mediastinal, hilar, or axillary lymph nodes identified.

The intrathoracic aorta is of normal caliber and appearance. The
great vessels are within normal limits.

Large hiatal hernia noted with almost the entirety of the stomach
located within the thorax.

Heart size within normal limits.  No pericardial effusion.

Focal nonocclusive filling defect present within a right lower lobe
segmental pulmonary artery (series 5, image 79), consistent with
acute pulmonary embolism. Additional focal filling defects seen
within the proximal right upper lobe lobar artery (series 5, image
45). There is probable extension into the segmental right upper lobe
pulmonary artery branches. No definite left-sided emboli identified,
although a small faint hypodensity within the left lower lobe
segmental pulmonary artery may represent a small amount of
nonocclusive thrombus (series 5, image 65). No large central
pulmonary a saddle embolus. The main pulmonary artery measures at
the upper limits of normal at 3.1 cm in diameter. RV to LV ratio
equals 0.7 without evidence of right heart strain.

The lungs are clear without focal infiltrate, pulmonary edema, or
pleural effusion. No pneumothorax. No pulmonary nodule or mass.

Visualized portions of the upper abdomen are within normal limits.

No acute osseous abnormality. No worrisome lytic or blastic osseous
lesions.
IMPRESSION: 1. Acute nonocclusive right-sided pulmonary emboli involving right
lower lobe segmental pulmonary arteries and right upper lobe lobar
artery. No evidence of right heart strain or pulmonary infarct.
2. Large hiatal hernia.
Critical Value/emergent results were called by telephone at the time
of interpretation on 05/12/2013 at [DATE] to Dr. MONCHO JOON , who
verbally acknowledged these results.

## 2016-04-15 ENCOUNTER — Other Ambulatory Visit: Payer: Self-pay | Admitting: Family Medicine

## 2016-04-16 NOTE — Telephone Encounter (Signed)
Patient requesting refill of Belviq to [facility].

## 2016-04-23 ENCOUNTER — Ambulatory Visit: Payer: 59 | Admitting: Family Medicine

## 2016-05-29 ENCOUNTER — Other Ambulatory Visit: Payer: Self-pay

## 2016-05-29 DIAGNOSIS — G4700 Insomnia, unspecified: Secondary | ICD-10-CM

## 2016-05-29 NOTE — Telephone Encounter (Signed)
Patient requesting refill of Temazepam to [facility].

## 2016-06-03 NOTE — Telephone Encounter (Signed)
Rx for controlled substance denied Patient's last office note reviewed; she was supposed to be seen 3 months after last visit (mid-April) Patient needs an appointment for controlled substance if appropriate Thank you Raquel Sarna can see her)

## 2016-06-05 NOTE — Telephone Encounter (Signed)
Mailbox is full cannot leave message

## 2016-06-25 ENCOUNTER — Encounter: Payer: Self-pay | Admitting: Family Medicine

## 2016-06-25 ENCOUNTER — Ambulatory Visit (INDEPENDENT_AMBULATORY_CARE_PROVIDER_SITE_OTHER): Payer: 59 | Admitting: Family Medicine

## 2016-06-25 VITALS — BP 128/86 | HR 103 | Temp 98.3°F | Resp 16 | Ht 60.0 in | Wt 240.7 lb

## 2016-06-25 DIAGNOSIS — F324 Major depressive disorder, single episode, in partial remission: Secondary | ICD-10-CM

## 2016-06-25 DIAGNOSIS — Z862 Personal history of diseases of the blood and blood-forming organs and certain disorders involving the immune mechanism: Secondary | ICD-10-CM

## 2016-06-25 DIAGNOSIS — G4700 Insomnia, unspecified: Secondary | ICD-10-CM | POA: Diagnosis not present

## 2016-06-25 DIAGNOSIS — Z13220 Encounter for screening for lipoid disorders: Secondary | ICD-10-CM | POA: Diagnosis not present

## 2016-06-25 DIAGNOSIS — E559 Vitamin D deficiency, unspecified: Secondary | ICD-10-CM

## 2016-06-25 DIAGNOSIS — I10 Essential (primary) hypertension: Secondary | ICD-10-CM | POA: Diagnosis not present

## 2016-06-25 MED ORDER — SERTRALINE HCL 100 MG PO TABS: 100.0000 mg | ORAL_TABLET | Freq: Every day | ORAL | 1 refills | Status: DC

## 2016-06-25 MED ORDER — BUPROPION HCL ER (XL) 300 MG PO TB24: 300.0000 mg | ORAL_TABLET | Freq: Every day | ORAL | 1 refills | Status: DC

## 2016-06-25 MED ORDER — LORCASERIN HCL ER 20 MG PO TB24: 1.0000 | ORAL_TABLET | Freq: Every day | ORAL | 0 refills | Status: DC

## 2016-06-25 MED ORDER — TEMAZEPAM 30 MG PO CAPS: 30.0000 mg | ORAL_CAPSULE | Freq: Every evening | ORAL | 0 refills | Status: DC | PRN

## 2016-06-25 MED ORDER — TELMISARTAN-HCTZ 80-25 MG PO TABS: 1.0000 | ORAL_TABLET | Freq: Every day | ORAL | 1 refills | Status: DC

## 2016-06-25 NOTE — Progress Notes (Signed)
Name: Brooke Page   MRN: [id number]    DOB: [birth year]   Date:06/25/2016       Progress Note  Subjective  Chief Complaint  Chief Complaint  Patient presents with  . Medication Refill    4 month F/U  . Hypertension    Denies any symptoms  . Insomnia  . Depression  . Obesity    Has ran out of medications    HPI  HTN: she is back on bp medication, no side effects. She denieschest pain, edema,palpitation, or SOB. BP at home has been at goal. Usually 120's/80's   Major Depression Mild : still grieving the loss of her brother,she is doing well, anniversary of his death was 30-Oct-2015 and she did very well ( 3 years ago ) She has been on Zoloft for many years ( started with the birth of twins ), but we adding Wellbutrin Summer 2017. No longer having crying spells. The work volume increased since March 2017 and she is now able to handle the stress. Denies suicidal thoughts or ideation.   Insomnia:she was  taking Temazepam and was  helping her fall and stay asleep, no side effects, but could not sleep well without medication, she has been out for the past 2 weeks  Morbid obesity: she has been obese since her teens, but worse after the first child was born 45 years ago her weight was 200 lbs, she lost some weight, but after the birth of her daughter she went down to 220 lbs, after the twins her weight was up to 286 lbs. She lost weight after she nursed the twins, and went down to 220 lbs.  We gave her Belviq rx Fall of 2017 and it curbed her appetite, she took it for 2 months and weight was down, but had an insurance change/ She has been cooking healthier meals at home, eating more salads and vegetables. Lost 2 more lbs on her own, insurance is covering Belviq again and she would like a refill today. Weight of 240.7 lbs  Left ventricular hypertrophy on EKG: had Echo done 01/2016 and it was normal , gave her reassurance today  History iron deficiency anemia: she had blood  transfusion 2015, likely from gastric ulcer, she has occasional discomfort from hiatal hernia not  blood in stools. She had EGD and colonoscopy. Per patient some esophageal stricture, back in 2015 by Dr. Hannah Beat - she states she chew food in small pieces to avoid the sensation of dysphagia.     Patient Active Problem List   Diagnosis Date Noted  . Obesity, morbid ([place]) 08/08/2015  . Ventricular hypertrophy determined by echocardiography 05/18/2015  . History of trichomonal vaginitis 12/24/2014  . Family history of colon cancer 07/13/2014  . Family history of breast cancer 07/13/2014  . Mitral regurgitation 07/11/2014  . Hiatal hernia without gangrene and obstruction 07/11/2014  . Seasonal allergic rhinitis 07/11/2014  . History of pre-eclampsia 07/11/2014  . Type A blood, Rh positive 07/11/2014  . Intramural leiomyoma of uterus 07/11/2014  . History of cervical dysplasia 07/11/2014  . Major depression in partial remission ([place]) 07/11/2014  . Controlled insomnia 07/11/2014  . Menorrhagia with regular cycle 07/11/2014  . Obesity (BMI 30.0-34.9) 07/11/2014  . History of iron deficiency anemia 07/11/2014  . Vitamin D deficiency 07/11/2014  . Hypertension, benign 07/11/2014  . History of blood transfusion 07/11/2014  . History of pulmonary embolism 05/08/2013  . History of DVT (deep vein thrombosis) 05/08/2013    Past Surgical  History:  Procedure Laterality Date  . ABDOMINAL HYSTERECTOMY    . CESAREAN SECTION    . ENDOMETRIAL BIOPSY  05/27/13   no hyperplasia or carcionam, weakly proliferative endometrium,stomal and or glandular breakdown- [facility]  . LEEP  2000  . PERIPHERAL VASCULAR CATHETERIZATION N/A 05/14/2014   Procedure: ivc filter removal;  Surgeon: Katha Cabal, MD;  Location: [facility];  Service: Cardiovascular;  Laterality: N/A;  . TONSILLECTOMY    . VENOGRAM N/A 05/14/2014   Procedure: Venogram;  Surgeon: Katha Cabal, MD;  Location: [facility];  Service: Cardiovascular;  Laterality: N/A;  IVC    Family History  Problem Relation Age of Onset  . Cancer Mother        Colon and Breast  . Hypertension Mother   . Breast cancer Mother 34  . Diabetes Father   . Hypertension Father   . Diabetes Brother   . Pulmonary embolism Brother   . Obesity Brother   . Asthma Son     Social History   Social History  . Marital status: Married    Spouse name: N/A  . Number of children: N/A  . Years of education: N/A   Occupational History  . Not on file.   Social History Main Topics  . Smoking status: Never Smoker  . Smokeless tobacco: Never Used  . Alcohol use 0.0 oz/week     Comment: Occasional  . Drug use: No  . Sexual activity: Yes    Partners: Male    Birth control/ protection: None     Comment: Tubligation   Other Topics Concern  . Not on file   Social History Narrative  . No narrative on file     Current Outpatient Prescriptions:  .  BELVIQ XR 20 MG TB24, TAKE 1 TABLET BY MOUTH DAILY, Disp: 30 tablet, Rfl: 0 .  buPROPion (WELLBUTRIN XL) 300 MG 24 hr tablet, Take 1 tablet (300 mg total) by mouth daily., Disp: 90 tablet, Rfl: 1 .  cholecalciferol (VITAMIN D) 1000 UNITS tablet, Take 1,000 Units by mouth daily., Disp: , Rfl:  .  ferrous sulfate 325 (65 FE) MG tablet, Take 1 tablet by mouth daily., Disp: , Rfl:  .  fluticasone (FLONASE) 50 MCG/ACT nasal spray, Place 2 sprays into both nostrils daily., Disp: 42 g, Rfl: 0 .  loratadine (CLARITIN) 10 MG tablet, Take 1 tablet by mouth as needed., Disp: , Rfl:  .  sertraline (ZOLOFT) 100 MG tablet, Take 1 tablet (100 mg total) by mouth daily., Disp: 90 tablet, Rfl: 1 .  telmisartan-hydrochlorothiazide (MICARDIS HCT) 80-25 MG tablet, Take 1 tablet by mouth daily., Disp: 90 tablet, Rfl: 1 .  temazepam (RESTORIL) 30 MG capsule, Take 1 capsule (30 mg total) by mouth at bedtime as needed for sleep., Disp: 90 capsule, Rfl: 0  Allergies  Allergen Reactions  . Cephalosporins    . Ceftin [Cefuroxime Axetil] Rash     ROS  Constitutional: Negative for fever or significant weight change.  Respiratory: Negative for cough and shortness of breath.   Cardiovascular: Negative for chest pain or palpitations.  Gastrointestinal: Negative for abdominal pain, no bowel changes.  Musculoskeletal: Negative for gait problem or joint swelling.  Skin: Negative for rash.  Neurological: Negative for dizziness or headache.  No other specific complaints in a complete review of systems (except as listed in HPI above).  Objective  Vitals:   06/25/16 1442  BP: 128/86  Pulse: (!) 103  Resp: 16  Temp: 98.3 F (36.8 C)  TempSrc: Oral  SpO2: 96%  Weight: 240 lb 11.2 oz (109.2 kg)  Height: 5' (1.524 m)    Body mass index is 47.01 kg/m.  Physical Exam  Constitutional: Patient appears well-developed and well-nourished. Obese  No distress.  HEENT: head atraumatic, normocephalic, pupils equal and reactive to light, neck supple, throat within normal limits Cardiovascular: Normal rate, regular rhythm and normal heart sounds.  No murmur heard. No BLE edema. Pulmonary/Chest: Effort normal and breath sounds normal. No respiratory distress. Abdominal: Soft.  There is no tenderness. Psychiatric: Patient has a normal mood and affect. behavior is normal. Judgment and thought content normal.  PHQ2/9: Depression screen [facility] 2/9 06/25/2016 01/23/2016 10/24/2015 08/08/2015 05/18/2015  Decreased Interest 0 0 0 1 0  Down, Depressed, Hopeless 0 0 0 0 0  PHQ - 2 Score 0 0 0 1 0     Fall Risk: Fall Risk  06/25/2016 01/23/2016 10/24/2015 08/08/2015 05/18/2015  Falls in the past year? No No No No No  Number falls in past yr: - - - - -  Injury with Fall? - - - - -     Functional Status Survey: Is the patient deaf or have difficulty hearing?: No Does the patient have difficulty seeing, even when wearing glasses/contacts?: No Does the patient have difficulty concentrating, remembering, or  making decisions?: No Does the patient have difficulty walking or climbing stairs?: No Does the patient have difficulty dressing or bathing?: No Does the patient have difficulty doing errands alone such as visiting a doctor's office or shopping?: No    Assessment & Plan  1. Hypertension, benign  - telmisartan-hydrochlorothiazide (MICARDIS HCT) 80-25 MG tablet; Take 1 tablet by mouth daily.  Dispense: 90 tablet; Refill: 1  2. Major depressive disorder with single episode, in partial remission (HCC)  - sertraline (ZOLOFT) 100 MG tablet; Take 1 tablet (100 mg total) by mouth daily.  Dispense: 90 tablet; Refill: 1 - buPROPion (WELLBUTRIN XL) 300 MG 24 hr tablet; Take 1 tablet (300 mg total) by mouth daily.  Dispense: 90 tablet; Refill: 1  3. Controlled insomnia  - temazepam (RESTORIL) 30 MG capsule; Take 1 capsule (30 mg total) by mouth at bedtime as needed for sleep.  Dispense: 90 capsule; Refill: 0  4. Morbid obesity, unspecified obesity type ([place])  - Hemoglobin A1c - Insulin, random - Lorcaserin HCl ER (BELVIQ XR) 20 MG TB24; Take 1 tablet by mouth daily.  Dispense: 90 tablet; Refill: 0  5. History of iron deficiency anemia  - CBC with Differential/Platelet - Comp. Metabolic Panel (12) - Fe+TIBC+Fer  6. Vitamin D deficiency  - VITAMIN D 25 Hydroxy (Vit-D Deficiency, Fractures)  7. Lipid screening  - Lipid panel

## 2016-06-28 ENCOUNTER — Other Ambulatory Visit: Payer: Self-pay

## 2016-06-28 DIAGNOSIS — G4700 Insomnia, unspecified: Secondary | ICD-10-CM

## 2016-06-28 NOTE — Telephone Encounter (Signed)
Patient requesting [facility].

## 2016-08-23 LAB — LIPID PANEL
Cholesterol: 167 (ref 0–200)
HDL: 75 — AB (ref 35–70)
LDL Cholesterol: 78
TRIGLYCERIDES: 70 (ref 40–160)

## 2016-08-23 LAB — HEMOGLOBIN A1C: Hemoglobin A1C: 5.6

## 2016-08-23 LAB — BASIC METABOLIC PANEL: GLUCOSE: 84

## 2016-09-03 DIAGNOSIS — Z13220 Encounter for screening for lipoid disorders: Secondary | ICD-10-CM | POA: Diagnosis not present

## 2016-09-03 DIAGNOSIS — Z862 Personal history of diseases of the blood and blood-forming organs and certain disorders involving the immune mechanism: Secondary | ICD-10-CM | POA: Diagnosis not present

## 2016-09-04 LAB — INSULIN, RANDOM: INSULIN: 11.8 u[IU]/mL (ref 2.6–24.9)

## 2016-09-04 LAB — CBC WITH DIFFERENTIAL/PLATELET
BASOS ABS: 0 10*3/uL (ref 0.0–0.2)
Basos: 0 %
EOS (ABSOLUTE): 0.1 10*3/uL (ref 0.0–0.4)
EOS: 1 %
HEMATOCRIT: 37 % (ref 34.0–46.6)
HEMOGLOBIN: 11.2 g/dL (ref 11.1–15.9)
IMMATURE GRANS (ABS): 0 10*3/uL (ref 0.0–0.1)
IMMATURE GRANULOCYTES: 0 %
LYMPHS ABS: 1.1 10*3/uL (ref 0.7–3.1)
LYMPHS: 15 %
MCH: 23.6 pg — ABNORMAL LOW (ref 26.6–33.0)
MCHC: 30.3 g/dL — AB (ref 31.5–35.7)
MCV: 78 fL — ABNORMAL LOW (ref 79–97)
MONOCYTES: 7 %
Monocytes Absolute: 0.5 10*3/uL (ref 0.1–0.9)
Neutrophils Absolute: 5.5 10*3/uL (ref 1.4–7.0)
Neutrophils: 77 %
Platelets: 451 10*3/uL — ABNORMAL HIGH (ref 150–379)
RBC: 4.75 x10E6/uL (ref 3.77–5.28)
RDW: 15.4 % (ref 12.3–15.4)
WBC: 7.3 10*3/uL (ref 3.4–10.8)

## 2016-09-04 LAB — LIPID PANEL
Chol/HDL Ratio: 2.4 ratio (ref 0.0–4.4)
Cholesterol, Total: 157 mg/dL (ref 100–199)
HDL: 66 mg/dL (ref >39)
LDL Calculated: 77 mg/dL (ref 0–99)
TRIGLYCERIDES: 69 mg/dL (ref 0–149)
VLDL Cholesterol Cal: 14 mg/dL (ref 5–40)

## 2016-09-04 LAB — COMP. METABOLIC PANEL (12)
ALBUMIN: 4.3 g/dL (ref 3.5–5.5)
ALK PHOS: 55 IU/L (ref 39–117)
AST: 15 IU/L (ref 0–40)
Albumin/Globulin Ratio: 1.5 (ref 1.2–2.2)
BILIRUBIN TOTAL: 0.3 mg/dL (ref 0.0–1.2)
BUN/Creatinine Ratio: 16 (ref 9–23)
BUN: 13 mg/dL (ref 6–24)
CREATININE: 0.8 mg/dL (ref 0.57–1.00)
Calcium: 9.3 mg/dL (ref 8.7–10.2)
Chloride: 99 mmol/L (ref 96–106)
GFR calc Af Amer: 104 mL/min/{1.73_m2} (ref >59)
GFR calc non Af Amer: 90 mL/min/{1.73_m2} (ref >59)
Globulin, Total: 2.9 g/dL (ref 1.5–4.5)
Glucose: 87 mg/dL (ref 65–99)
Potassium: 4.5 mmol/L (ref 3.5–5.2)
SODIUM: 140 mmol/L (ref 134–144)
Total Protein: 7.2 g/dL (ref 6.0–8.5)

## 2016-09-04 LAB — HEMOGLOBIN A1C
ESTIMATED AVERAGE GLUCOSE: 108 mg/dL
HEMOGLOBIN A1C: 5.4 % (ref 4.8–5.6)

## 2016-09-04 LAB — VITAMIN D 25 HYDROXY (VIT D DEFICIENCY, FRACTURES): VIT D 25 HYDROXY: 36.3 ng/mL (ref 30.0–100.0)

## 2016-09-06 ENCOUNTER — Other Ambulatory Visit: Payer: Self-pay | Admitting: Family Medicine

## 2016-09-06 DIAGNOSIS — Z1231 Encounter for screening mammogram for malignant neoplasm of breast: Secondary | ICD-10-CM

## 2016-09-17 ENCOUNTER — Ambulatory Visit
Admission: RE | Admit: 2016-09-17 | Discharge: 2016-09-17 | Disposition: A | Discharge status: Home / Self Care | Payer: 59 | Source: Ambulatory Visit | Attending: Family Medicine | Admitting: Family Medicine

## 2016-09-17 DIAGNOSIS — Z1231 Encounter for screening mammogram for malignant neoplasm of breast: Secondary | ICD-10-CM | POA: Diagnosis not present

## 2016-09-25 ENCOUNTER — Encounter: Payer: Self-pay | Admitting: Family Medicine

## 2016-09-25 ENCOUNTER — Ambulatory Visit (INDEPENDENT_AMBULATORY_CARE_PROVIDER_SITE_OTHER): Payer: 59 | Admitting: Family Medicine

## 2016-09-25 VITALS — BP 148/84 | HR 117 | Temp 98.7°F | Resp 16 | Ht 60.0 in | Wt 237.5 lb

## 2016-09-25 DIAGNOSIS — M542 Cervicalgia: Secondary | ICD-10-CM

## 2016-09-25 DIAGNOSIS — Z23 Encounter for immunization: Secondary | ICD-10-CM

## 2016-09-25 DIAGNOSIS — F324 Major depressive disorder, single episode, in partial remission: Secondary | ICD-10-CM | POA: Diagnosis not present

## 2016-09-25 DIAGNOSIS — D75839 Thrombocytosis, unspecified: Secondary | ICD-10-CM

## 2016-09-25 DIAGNOSIS — G4700 Insomnia, unspecified: Secondary | ICD-10-CM | POA: Diagnosis not present

## 2016-09-25 DIAGNOSIS — D473 Essential (hemorrhagic) thrombocythemia: Secondary | ICD-10-CM

## 2016-09-25 DIAGNOSIS — Z862 Personal history of diseases of the blood and blood-forming organs and certain disorders involving the immune mechanism: Secondary | ICD-10-CM | POA: Diagnosis not present

## 2016-09-25 DIAGNOSIS — I10 Essential (primary) hypertension: Secondary | ICD-10-CM

## 2016-09-25 DIAGNOSIS — E559 Vitamin D deficiency, unspecified: Secondary | ICD-10-CM

## 2016-09-25 MED ORDER — METAXALONE 800 MG PO TABS: 800.0000 mg | ORAL_TABLET | Freq: Three times a day (TID) | ORAL | 0 refills | Status: DC

## 2016-09-25 MED ORDER — LORCASERIN HCL ER 20 MG PO TB24: 1.0000 | ORAL_TABLET | Freq: Every day | ORAL | 2 refills | Status: DC

## 2016-09-25 MED ORDER — TEMAZEPAM 30 MG PO CAPS: 30.0000 mg | ORAL_CAPSULE | Freq: Every evening | ORAL | 0 refills | Status: DC | PRN

## 2016-09-25 NOTE — Progress Notes (Signed)
Name: Brooke Page   MRN: [id number]    DOB: Jan 30, 1970   Date:09/25/2016       Progress Note  Subjective  Chief Complaint  Chief Complaint  Patient presents with  . Hypertension    3 month follow up  . Obesity  . Depression  . Insomnia  . Neck Pain  . Flu Vaccine    HPI  HTN: she is back on bp medication, no side effects. She denieschest pain, edema,palpitation, or SOB. BP at home has been at goal. Usually 120's/80's BP was elevated today when she first got in, worried about her children getting off the bus and came in rushing.   Major Depression Mild : still grieving the loss of her brother,she is doing well, anniversary of his death was 10-21-2015 and she did very well ( 3 years ago ) She has been on Zoloft for many years ( started with the birth of twins ), but we adding Wellbutrin Summer 2017. No longer having crying spells. The work volume increased since March 2017 and she is now able to handle the stress. Denies suicidal thoughts or ideation. She has been doing well, even though she misses her sister that died.  Insomnia:she was  taking Temazepam and was  helping her fall and stay asleep, most nights she sleeps well, but if she goes to bed very early she gets up in the middle of the night  Morbid obesity: she has been obese since her teens, but worse after the first child was born 48 years ago her weight was 200 lbs, she lost some weight, but after the birth of her daughter she went down to 220 lbs, after the twins her weight was up to 286 lbs. She lost weight after she nursed the twins, and went down to 220 lbs. We gave her Belviq rx Fall of 2017 and it curbed her appetite, she took it for 2 months and weight was down, but had an insurance change/ She has been cooking healthier meals at home, eating more salads and vegetables. Lost another 5 lbs since last visit without Belviq, since she was not allowed to fill at mail order, but she will fill it local pharmacy  today.  Left ventricular hypertrophy on EKG: had Echo done 01/2016 and it was normal , gave her reassurance today  History iron deficiency anemia: she had blood transfusion 2015, likely from gastric ulcer, she has occasional discomfort from hiatal hernia not  blood in stools. She had EGD and colonoscopy. Per patient some esophageal stricture, back in 2015 by Dr. Hannah Beat - she states she chew food in small pieces to avoid the sensation of dysphagia. hgb was normal in August, but high platelets, she is back on iron supplementation. Discussed going back to GI - but she wants to hold off for now   Patient Active Problem List   Diagnosis Date Noted  . Obesity, morbid ([place]) 08/08/2015  . History of trichomonal vaginitis 12/24/2014  . Family history of colon cancer 07/13/2014  . Family history of breast cancer 07/13/2014  . Mitral regurgitation 07/11/2014  . Hiatal hernia without gangrene and obstruction 07/11/2014  . Seasonal allergic rhinitis 07/11/2014  . History of pre-eclampsia 07/11/2014  . Type A blood, Rh positive 07/11/2014  . Intramural leiomyoma of uterus 07/11/2014  . History of cervical dysplasia 07/11/2014  . Major depression in partial remission ([place]) 07/11/2014  . Controlled insomnia 07/11/2014  . Menorrhagia with regular cycle 07/11/2014  . Obesity (BMI 30.0-34.9)  07/11/2014  . History of iron deficiency anemia 07/11/2014  . Vitamin D deficiency 07/11/2014  . Hypertension, benign 07/11/2014  . History of blood transfusion 07/11/2014  . History of pulmonary embolism 05/08/2013  . History of DVT (deep vein thrombosis) 05/08/2013    Past Surgical History:  Procedure Laterality Date  . ABDOMINAL HYSTERECTOMY    . CESAREAN SECTION    . ENDOMETRIAL BIOPSY  05/27/13   no hyperplasia or carcionam, weakly proliferative endometrium,stomal and or glandular breakdown- [facility]  . LEEP  2000  . PERIPHERAL VASCULAR CATHETERIZATION N/A 05/14/2014   Procedure: ivc filter removal;   Surgeon: Katha Cabal, MD;  Location: [place] [facility];  Service: Cardiovascular;  Laterality: N/A;  . TONSILLECTOMY    . VENOGRAM N/A 05/14/2014   Procedure: Venogram;  Surgeon: Katha Cabal, MD;  Location: [place] [facility];  Service: Cardiovascular;  Laterality: N/A;  IVC    Family History  Problem Relation Age of Onset  . Cancer Mother        Colon and Breast  . Hypertension Mother   . Breast cancer Mother 21  . Diabetes Father   . Hypertension Father   . Diabetes Brother   . Pulmonary embolism Brother   . Obesity Brother   . Asthma Son     Social History   Social History  . Marital status: Married    Spouse name: N/A  . Number of children: N/A  . Years of education: N/A   Occupational History  . Not on file.   Social History Main Topics  . Smoking status: Never Smoker  . Smokeless tobacco: Never Used  . Alcohol use 0.0 oz/week     Comment: Occasional  . Drug use: No  . Sexual activity: Yes    Partners: Male    Birth control/ protection: None     Comment: Tubligation   Other Topics Concern  . Not on file   Social History Narrative  . No narrative on file     Current Outpatient Prescriptions:  .  buPROPion (WELLBUTRIN XL) 300 MG 24 hr tablet, Take 1 tablet (300 mg total) by mouth daily., Disp: 90 tablet, Rfl: 1 .  cholecalciferol (VITAMIN D) 1000 UNITS tablet, Take 1,000 Units by mouth daily., Disp: , Rfl:  .  ferrous sulfate 325 (65 FE) MG tablet, Take 1 tablet by mouth daily., Disp: , Rfl:  .  fluticasone (FLONASE) 50 MCG/ACT nasal spray, Place 2 sprays into both nostrils daily., Disp: 42 g, Rfl: 0 .  loratadine (CLARITIN) 10 MG tablet, Take 1 tablet by mouth as needed., Disp: , Rfl:  .  Lorcaserin HCl ER (BELVIQ XR) 20 MG TB24, Take 1 tablet by mouth daily., Disp: 30 tablet, Rfl: 2 .  sertraline (ZOLOFT) 100 MG tablet, Take 1 tablet (100 mg total) by mouth daily., Disp: 90 tablet, Rfl: 1 .  telmisartan-hydrochlorothiazide (MICARDIS HCT)  80-25 MG tablet, Take 1 tablet by mouth daily., Disp: 90 tablet, Rfl: 1 .  temazepam (RESTORIL) 30 MG capsule, Take 1 capsule (30 mg total) by mouth at bedtime as needed for sleep., Disp: 90 capsule, Rfl: 0  Allergies  Allergen Reactions  . Cephalosporins   . Ceftin [Cefuroxime Axetil] Rash     ROS  Constitutional: Negative for fever or weight change.  Respiratory: Negative for cough and shortness of breath.   Cardiovascular: Negative for chest pain or palpitations.  Gastrointestinal: Negative for abdominal pain, no bowel changes.  Musculoskeletal: Negative for gait problem or  joint swelling.  Skin: Negative for rash.  Neurological: Negative for dizziness or headache.  No other specific complaints in a complete review of systems (except as listed in HPI above).  Objective  Vitals:   09/25/16 1501  BP: (!) 148/84  Pulse: (!) 117  Resp: 16  Temp: 98.7 F (37.1 C)  SpO2: 98%  Weight: 237 lb 8 oz (107.7 kg)  Height: 5' (1.524 m)    Body mass index is 46.38 kg/m.  Physical Exam  Constitutional: Patient appears well-developed and well-nourished. ObeseNo distress.  HEENT: head atraumatic, normocephalic, pupils equal and reactive to light, neck supple, throat within normal limits Cardiovascular: Normal rate, regular rhythm and normal heart sounds.  No murmur heard. No BLE edema. Pulmonary/Chest: Effort normal and breath sounds normal. No respiratory distress. Abdominal: Soft.  There is no tenderness. Psychiatric: Patient has a normal mood and affect. behavior is normal. Judgment and thought content normal. Muscular Skeletal: pain with rom of neck, worse when extension and left rotation, also with palpation of trapezium muscle bilaterally, normal rom of both shoulders  Recent Results (from the past 2160 hour(s))  Hemoglobin A1c     Status: None   Collection Time: 09/03/16  8:36 AM  Result Value Ref Range   Hgb A1c MFr Bld 5.4 4.8 - 5.6 %    Comment:          Prediabetes:  5.7 - 6.4          Diabetes: >6.4          Glycemic control for adults with diabetes: <7.0    Est. average glucose Bld gHb Est-mCnc 108 mg/dL  Insulin, random     Status: None   Collection Time: 09/03/16  8:36 AM  Result Value Ref Range   INSULIN 11.8 2.6 - 24.9 uIU/mL  Lipid panel     Status: None   Collection Time: 09/03/16  8:36 AM  Result Value Ref Range   Cholesterol, Total 157 100 - 199 mg/dL   Triglycerides 69 0 - 149 mg/dL   HDL 66 >39 mg/dL   VLDL Cholesterol Cal 14 5 - 40 mg/dL   LDL Calculated 77 0 - 99 mg/dL   Chol/HDL Ratio 2.4 0.0 - 4.4 ratio    Comment:                                   T. Chol/HDL Ratio                                             Men  Women                               1/2 Avg.Risk  3.4    3.3                                   Avg.Risk  5.0    4.4                                2X Avg.Risk  9.6    7.1  3X Avg.Risk 23.4   11.0   CBC with Differential/Platelet     Status: Abnormal   Collection Time: 09/03/16  8:36 AM  Result Value Ref Range   WBC 7.3 3.4 - 10.8 x10E3/uL   RBC 4.75 3.77 - 5.28 x10E6/uL   Hemoglobin 11.2 11.1 - 15.9 g/dL   Hematocrit 37.0 34.0 - 46.6 %   MCV 78 (L) 79 - 97 fL   MCH 23.6 (L) 26.6 - 33.0 pg   MCHC 30.3 (L) 31.5 - 35.7 g/dL   RDW 15.4 12.3 - 15.4 %   Platelets 451 (H) 150 - 379 x10E3/uL   Neutrophils 77 Not Estab. %   Lymphs 15 Not Estab. %   Monocytes 7 Not Estab. %   Eos 1 Not Estab. %   Basos 0 Not Estab. %   Neutrophils Absolute 5.5 1.4 - 7.0 x10E3/uL   Lymphocytes Absolute 1.1 0.7 - 3.1 x10E3/uL   Monocytes Absolute 0.5 0.1 - 0.9 x10E3/uL   EOS (ABSOLUTE) 0.1 0.0 - 0.4 x10E3/uL   Basophils Absolute 0.0 0.0 - 0.2 x10E3/uL   Immature Granulocytes 0 Not Estab. %   Immature Grans (Abs) 0.0 0.0 - 0.1 x10E3/uL  VITAMIN D 25 Hydroxy (Vit-D Deficiency, Fractures)     Status: None   Collection Time: 09/03/16  8:36 AM  Result Value Ref Range   Vit D, 25-Hydroxy 36.3 30.0 - 100.0  ng/mL    Comment: Vitamin D deficiency has been defined by the Institute of Medicine and an Endocrine Society practice guideline as a level of serum 25-OH vitamin D less than 20 ng/mL (1,2). The Endocrine Society went on to further define vitamin D insufficiency as a level between 21 and 29 ng/mL (2). 1. IOM (Institute of Medicine). 2010. Dietary reference    intakes for calcium and D. [place]: The    [employer]. 2. Holick MF, Binkley Finlayson, Bischoff-Ferrari HA, et al.    Evaluation, treatment, and prevention of vitamin D    deficiency: an Endocrine Society clinical practice    guideline. JCEM. 2011 Jul; 96(7):1911-30.   Comp. Metabolic Panel (12)     Status: None   Collection Time: 09/03/16  8:36 AM  Result Value Ref Range   Glucose 87 65 - 99 mg/dL   BUN 13 6 - 24 mg/dL   Creatinine, Ser 0.80 0.57 - 1.00 mg/dL   GFR calc non Af Amer 90 >59 mL/min/1.73   GFR calc Af Amer 104 >59 mL/min/1.73   BUN/Creatinine Ratio 16 9 - 23   Sodium 140 134 - 144 mmol/L   Potassium 4.5 3.5 - 5.2 mmol/L   Chloride 99 96 - 106 mmol/L   Calcium 9.3 8.7 - 10.2 mg/dL   Total Protein 7.2 6.0 - 8.5 g/dL   Albumin 4.3 3.5 - 5.5 g/dL   Globulin, Total 2.9 1.5 - 4.5 g/dL   Albumin/Globulin Ratio 1.5 1.2 - 2.2   Bilirubin Total 0.3 0.0 - 1.2 mg/dL   Alkaline Phosphatase 55 39 - 117 IU/L   AST 15 0 - 40 IU/L    PHQ2/9: Depression screen [facility] 2/9 06/25/2016 01/23/2016 10/24/2015 08/08/2015 05/18/2015  Decreased Interest 0 0 0 1 0  Down, Depressed, Hopeless 0 0 0 0 0  PHQ - 2 Score 0 0 0 1 0     Fall Risk: Fall Risk  06/25/2016 01/23/2016 10/24/2015 08/08/2015 05/18/2015  Falls in the past year? No No No No No  Number falls in past yr: - - - - -  Injury with Fall? - - - - -     Assessment & Plan  1. Hypertension, benign  BP is elevated today, we will recheck before she leaves  2. Need for influenza vaccination  - Flu Vaccine QUAD 6+ mos PF IM (Fluarix Quad PF)  3. Major  depressive disorder with single episode, in partial remission ([place])  Doing well on current regiment   4. Controlled insomnia  - temazepam (RESTORIL) 30 MG capsule; Take 1 capsule (30 mg total) by mouth at bedtime as needed for sleep.  Dispense: 90 capsule; Refill: 0  5. Morbid obesity, unspecified obesity type ([place])  Not currently taking medication, could not fill it through mail order, but lost 4 lbs since last visit, we will fill it for local pharmacy  - Lorcaserin HCl ER (BELVIQ XR) 20 MG TB24; Take 1 tablet by mouth daily.  Dispense: 30 tablet; Refill: 2  6. History of iron deficiency anemia  With high platelets, taking otc ferrous sulfate since she got results of labs with low MCV  7. Vitamin D deficiency  Continue otc supplementation   8. Thrombocytosis ([place])  We will recheck next visit   9. Neck pain  - metaxalone (SKELAXIN) 800 MG tablet; Take 1 tablet (800 mg total) by mouth 3 (three) times daily.  Dispense: 30 tablet; Refill: 0  Discussed seeing Chiropractor

## 2016-12-31 ENCOUNTER — Other Ambulatory Visit: Payer: Self-pay

## 2016-12-31 DIAGNOSIS — I10 Essential (primary) hypertension: Secondary | ICD-10-CM

## 2016-12-31 MED ORDER — TELMISARTAN-HCTZ 80-25 MG PO TABS: 1.0000 | ORAL_TABLET | Freq: Every day | ORAL | 0 refills | Status: DC

## 2016-12-31 NOTE — Telephone Encounter (Signed)
Hypertension medication request: Micardis to [facility].  Last office visit pertaining to hypertension:  BP Readings from Last 3 Encounters:  09/25/16 (!) 148/84  06/25/16 128/86  01/31/16 130/90    Lab Results  Component Value Date   CREATININE 0.80 09/03/2016   BUN 13 09/03/2016   NA 140 09/03/2016   K 4.5 09/03/2016   CL 99 09/03/2016   CO2 26 05/18/2015     No Follow-up on file.

## 2017-03-16 ENCOUNTER — Other Ambulatory Visit: Payer: Self-pay | Admitting: Family Medicine

## 2017-03-16 DIAGNOSIS — I10 Essential (primary) hypertension: Secondary | ICD-10-CM

## 2017-03-18 NOTE — Telephone Encounter (Signed)
Hypertension medication request: Micardis to [facility].   Last office visit pertaining to hypertension: 09/25/2016   BP Readings from Last 3 Encounters:  09/25/16 (!) 148/84  06/25/16 128/86  01/31/16 130/90    Lab Results  Component Value Date   CREATININE 0.80 09/03/2016   BUN 13 09/03/2016   NA 140 09/03/2016   K 4.5 09/03/2016   CL 99 09/03/2016   CO2 26 05/18/2015     No Follow-up on file.

## 2017-03-20 ENCOUNTER — Other Ambulatory Visit: Payer: Self-pay

## 2017-05-10 NOTE — Telephone Encounter (Signed)
Please sign off

## 2017-05-15 NOTE — Telephone Encounter (Signed)
Please schedule patient an follow up. Last visit was 09/25/16 and they will need an appt for medication refills.

## 2017-05-15 NOTE — Telephone Encounter (Signed)
Sign off what. I did not send to you and it want let me sign off anything.

## 2017-10-29 ENCOUNTER — Other Ambulatory Visit: Payer: Self-pay | Admitting: Family Medicine

## 2018-06-21 IMAGING — MG MM DIGITAL SCREENING BILAT W/ CAD
1 series · 5 of 5 positions shown · non-contrast
Comparison: Previous exam(s).

CLINICAL DATA: Screening.

EXAM:
DIGITAL SCREENING BILATERAL MAMMOGRAM WITH CAD

[R CC · right · 5 of 5 slices shown]
[im 1/5]
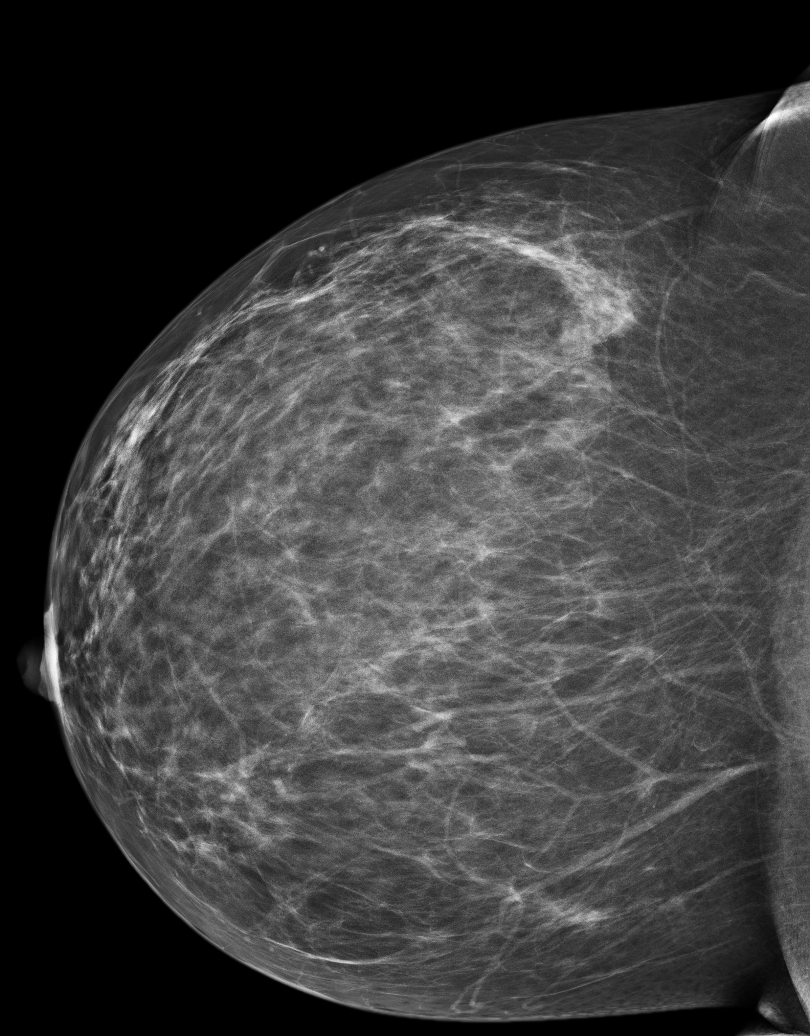
[im 2/5]
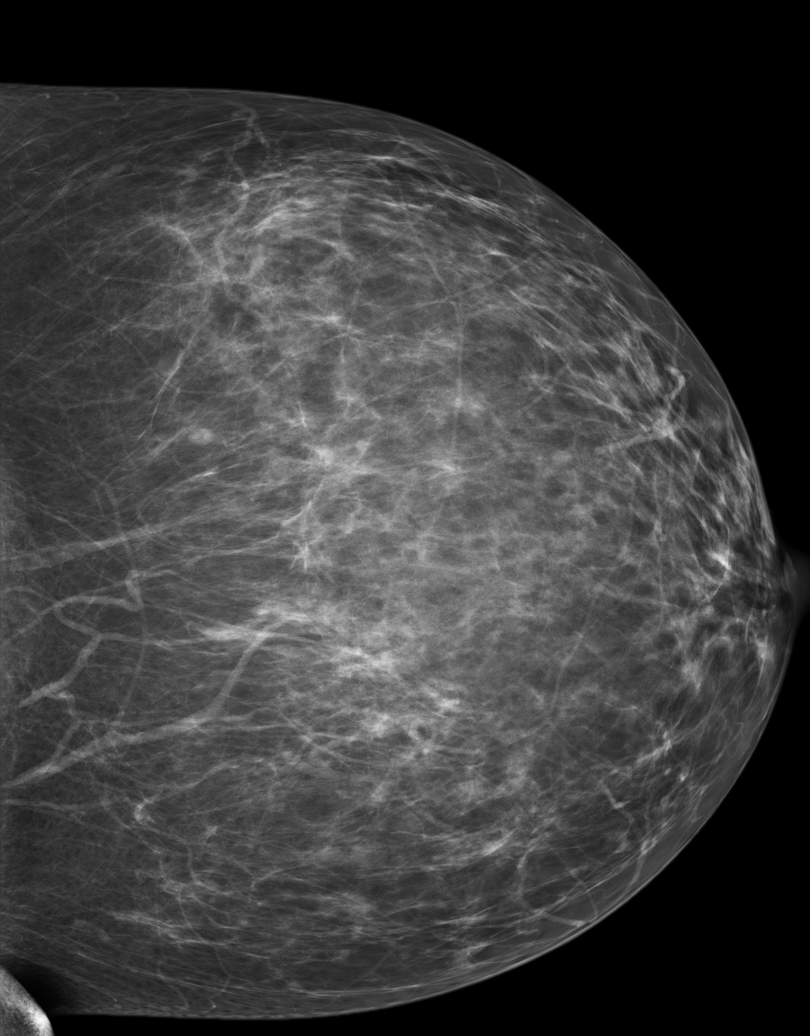
[im 3/5]
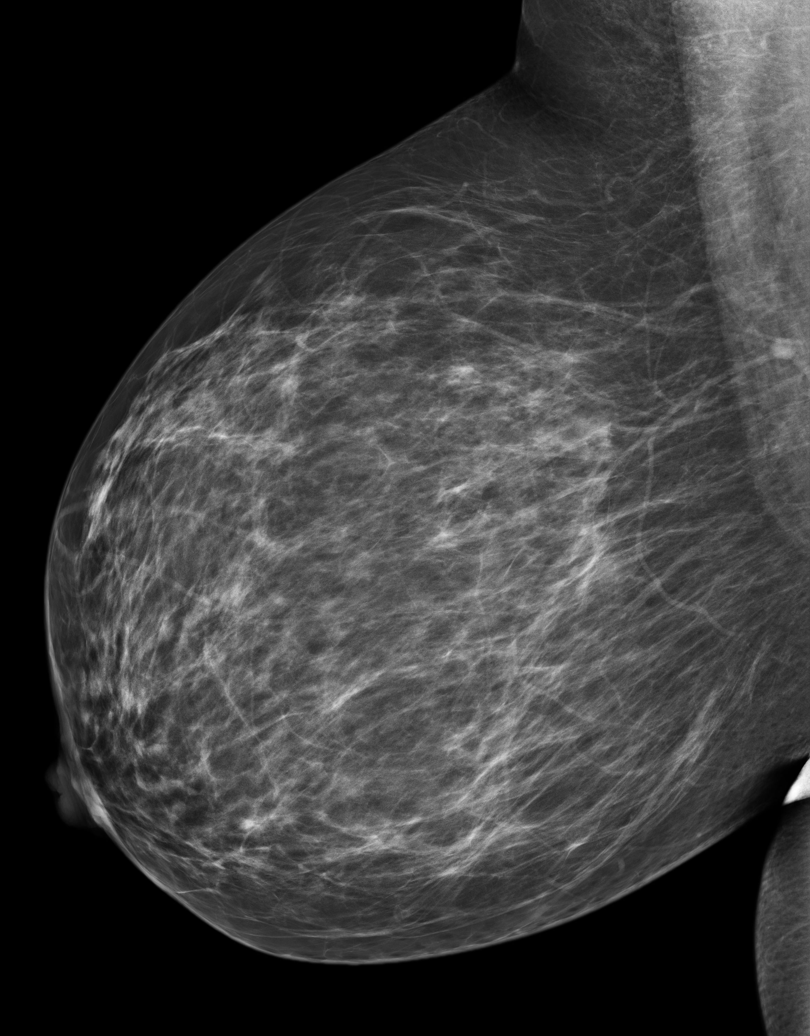
[im 4/5]
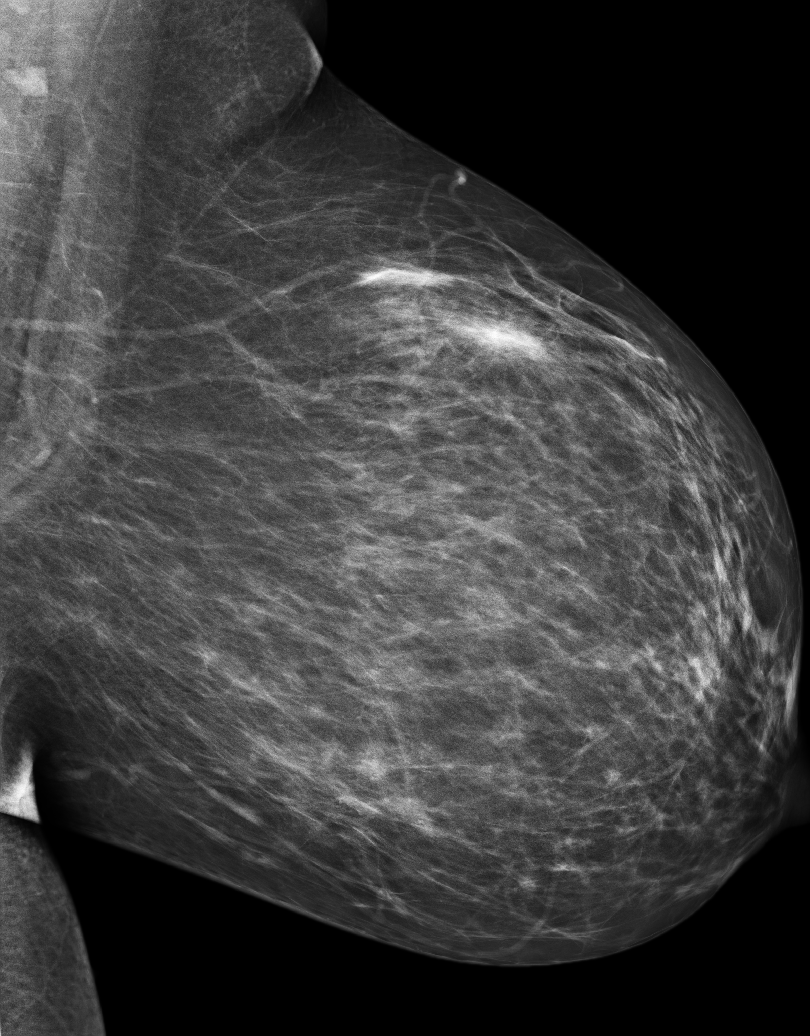
[im 5/5]
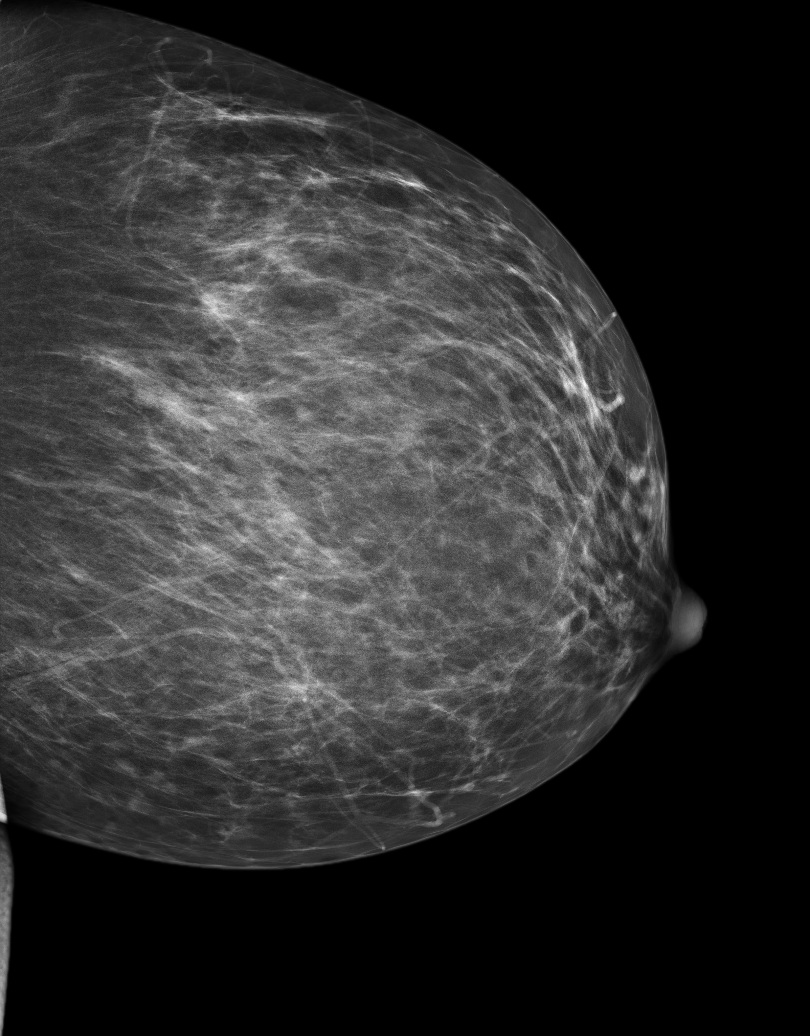

[5 of 5 positions shown; findings below may reference images not displayed]

ACR Breast Density Category b: There are scattered areas of
fibroglandular density.
FINDINGS: There are no findings suspicious for malignancy. Images were
processed with CAD.
IMPRESSION: No mammographic evidence of malignancy. A result letter of this
screening mammogram will be mailed directly to the patient.

RECOMMENDATION:
Screening mammogram in one year. (Code:AS-G-LCT)

BI-RADS CATEGORY  1: Negative.

## 2019-10-05 ENCOUNTER — Other Ambulatory Visit
Admission: RE | Admit: 2019-10-05 | Discharge: 2019-10-05 | Disposition: A | Discharge status: Home / Self Care | Payer: 59 | Source: Ambulatory Visit | Attending: General Surgery | Admitting: General Surgery

## 2019-10-05 ENCOUNTER — Other Ambulatory Visit: Payer: Self-pay

## 2019-10-05 DIAGNOSIS — Z20822 Contact with and (suspected) exposure to covid-19: Secondary | ICD-10-CM | POA: Insufficient documentation

## 2019-10-05 DIAGNOSIS — Z01812 Encounter for preprocedural laboratory examination: Secondary | ICD-10-CM | POA: Insufficient documentation

## 2019-10-05 LAB — SARS CORONAVIRUS 2 (TAT 6-24 HRS): SARS Coronavirus 2: NEGATIVE

## 2019-10-06 ENCOUNTER — Encounter: Payer: Self-pay | Admitting: General Surgery

## 2019-10-07 ENCOUNTER — Other Ambulatory Visit: Payer: Self-pay

## 2019-10-07 ENCOUNTER — Encounter: Payer: Self-pay | Admitting: General Surgery

## 2019-10-07 ENCOUNTER — Ambulatory Visit: Payer: 59 | Admitting: Anesthesiology

## 2019-10-07 ENCOUNTER — Encounter
Admission: RE | Disposition: A | Discharge status: Home / Self Care | Payer: Self-pay | Source: Home / Self Care | Attending: General Surgery

## 2019-10-07 ENCOUNTER — Ambulatory Visit
Admission: RE | Admit: 2019-10-07 | Discharge: 2019-10-07 | Disposition: A | Discharge status: Home / Self Care | Payer: 59 | Attending: General Surgery | Admitting: General Surgery

## 2019-10-07 DIAGNOSIS — R1310 Dysphagia, unspecified: Secondary | ICD-10-CM | POA: Diagnosis present

## 2019-10-07 DIAGNOSIS — Z800 Family history of malignant neoplasm of digestive organs: Secondary | ICD-10-CM | POA: Insufficient documentation

## 2019-10-07 DIAGNOSIS — Z8719 Personal history of other diseases of the digestive system: Secondary | ICD-10-CM | POA: Diagnosis not present

## 2019-10-07 DIAGNOSIS — Z881 Allergy status to other antibiotic agents status: Secondary | ICD-10-CM | POA: Insufficient documentation

## 2019-10-07 DIAGNOSIS — Z86711 Personal history of pulmonary embolism: Secondary | ICD-10-CM | POA: Insufficient documentation

## 2019-10-07 DIAGNOSIS — Z1211 Encounter for screening for malignant neoplasm of colon: Secondary | ICD-10-CM | POA: Insufficient documentation

## 2019-10-07 DIAGNOSIS — F329 Major depressive disorder, single episode, unspecified: Secondary | ICD-10-CM | POA: Diagnosis not present

## 2019-10-07 DIAGNOSIS — K5730 Diverticulosis of large intestine without perforation or abscess without bleeding: Secondary | ICD-10-CM | POA: Insufficient documentation

## 2019-10-07 DIAGNOSIS — K222 Esophageal obstruction: Secondary | ICD-10-CM | POA: Diagnosis not present

## 2019-10-07 DIAGNOSIS — Z79899 Other long term (current) drug therapy: Secondary | ICD-10-CM | POA: Diagnosis not present

## 2019-10-07 DIAGNOSIS — I10 Essential (primary) hypertension: Secondary | ICD-10-CM | POA: Insufficient documentation

## 2019-10-07 DIAGNOSIS — Z8711 Personal history of peptic ulcer disease: Secondary | ICD-10-CM | POA: Diagnosis not present

## 2019-10-07 DIAGNOSIS — K449 Diaphragmatic hernia without obstruction or gangrene: Secondary | ICD-10-CM | POA: Diagnosis not present

## 2019-10-07 DIAGNOSIS — Z87891 Personal history of nicotine dependence: Secondary | ICD-10-CM | POA: Insufficient documentation

## 2019-10-07 DIAGNOSIS — Z86718 Personal history of other venous thrombosis and embolism: Secondary | ICD-10-CM | POA: Diagnosis not present

## 2019-10-07 HISTORY — DX: Barrett's esophagus without dysplasia: K22.70

## 2019-10-07 HISTORY — PX: COLONOSCOPY WITH PROPOFOL: SHX5780

## 2019-10-07 HISTORY — DX: Anemia, unspecified: D64.9

## 2019-10-07 HISTORY — DX: Diverticulosis of intestine, part unspecified, without perforation or abscess without bleeding: K57.90

## 2019-10-07 HISTORY — DX: Peptic ulcer, site unspecified, unspecified as acute or chronic, without hemorrhage or perforation: K27.9

## 2019-10-07 HISTORY — DX: Dysmenorrhea, unspecified: N94.6

## 2019-10-07 HISTORY — PX: ESOPHAGOGASTRODUODENOSCOPY (EGD) WITH PROPOFOL: SHX5813

## 2019-10-07 LAB — POCT PREGNANCY, URINE: Preg Test, Ur: NEGATIVE

## 2019-10-07 SURGERY — COLONOSCOPY WITH PROPOFOL
Anesthesia: General

## 2019-10-07 MED ORDER — GLYCOPYRROLATE 0.2 MG/ML IJ SOLN
INTRAMUSCULAR | Status: DC | PRN
  Administered 2019-10-07: .2 mg via INTRAVENOUS

## 2019-10-07 MED ORDER — PROPOFOL 10 MG/ML IV BOLUS
INTRAVENOUS | Status: DC | PRN
  Administered 2019-10-07: 30 mg via INTRAVENOUS
  Administered 2019-10-07: 60 mg via INTRAVENOUS
  Administered 2019-10-07: 10 mg via INTRAVENOUS
  Administered 2019-10-07: 30 mg via INTRAVENOUS

## 2019-10-07 MED ORDER — LIDOCAINE HCL (PF) 2 % IJ SOLN
INTRAMUSCULAR | Status: DC | PRN
  Administered 2019-10-07: 100 mg via INTRADERMAL

## 2019-10-07 MED ORDER — SODIUM CHLORIDE 0.9 % IV SOLN: INTRAVENOUS | Status: DC

## 2019-10-07 MED ORDER — PROPOFOL 500 MG/50ML IV EMUL
INTRAVENOUS | Status: DC | PRN
  Administered 2019-10-07: 200 ug/kg/min via INTRAVENOUS

## 2019-10-07 MED ORDER — SUCRALFATE 1 GM/10ML PO SUSP: 1.0000 g | Freq: Four times a day (QID) | ORAL | 1 refills | Status: DC

## 2019-10-07 NOTE — Op Note (Signed)
[facility] Gastroenterology Patient Name: Brooke Page Procedure Date: 10/07/2019 8:43 AM MRN: [id number] Account #: [id number] Date of Birth: [birth year] Admit Type: Outpatient Age: 49 Room: [facility] ENDO ROOM 1 Gender: Female Note Status: Finalized Procedure:             Colonoscopy Indications:           Family history of colon cancer in a first-degree                         relative before age 23 years Providers:             Robert Bellow, MD Referring MD:          Bethena Roys. Sowles, MD (Referring MD) Medicines:             Monitored Anesthesia Care Complications:         No immediate complications. Procedure:             Pre-Anesthesia Assessment:                        - Prior to the procedure, a History and Physical was                         performed, and patient medications, allergies and                         sensitivities were reviewed. The patient's tolerance                         of previous anesthesia was reviewed.                        - The risks and benefits of the procedure and the                         sedation options and risks were discussed with the                         patient. All questions were answered and informed                         consent was obtained.                        After obtaining informed consent, the colonoscope was                         passed under direct vision. Throughout the procedure,                         the patient's blood pressure, pulse, and oxygen                         saturations were monitored continuously. The was                         introduced through the anus and advanced to the the  cecum, identified by appendiceal orifice and ileocecal                         valve. The colonoscopy was performed without                         difficulty. The patient tolerated the procedure well.                         The quality of the bowel preparation was  excellent. Findings:      Many medium-mouthed diverticula were found in the recto-sigmoid colon,       sigmoid colon and proximal ascending colon.      The retroflexed view of the distal rectum and anal verge was normal and       showed no anal or rectal abnormalities. Impression:            - Diverticulosis in the recto-sigmoid colon, in the                         sigmoid colon and in the proximal ascending colon.                        - The distal rectum and anal verge are normal on                         retroflexion view.                        - No specimens collected. Recommendation:        - Repeat colonoscopy in 5 years for surveillance. Procedure Code(s):     --- Professional ---                        [phone], Colonoscopy, flexible; diagnostic, including                         collection of specimen(s) by brushing or washing, when                         performed (separate procedure) Diagnosis Code(s):     --- Professional ---                        Z80.0, Family history of malignant neoplasm of                         digestive organs                        K57.30, Diverticulosis of large intestine without                         perforation or abscess without bleeding CPT copyright 2019 American Medical Association. All rights reserved. The codes documented in this report are preliminary and upon coder review may  be revised to meet current compliance requirements. Robert Bellow, MD 10/07/2019 9:29:08 AM This report has been signed electronically. Number of Addenda: 0 Note Initiated On: 10/07/2019 8:43 AM Scope Withdrawal Time: 0 hours 7 minutes 7 seconds  Total Procedure Duration: 0 hours 14  minutes 40 seconds       [facility]

## 2019-10-07 NOTE — H&P (Signed)
Brooke Page [id number] 1970/11/22     HPI:  49 y/o woman with a long history of an intermittently symptomatic hiatal hernia. EGD in 2015 with Dr. Gustavo Lah. Past history of microcytic anemia with out source identified.  Prior VTE, possibly secondary to trauma. Workup with heme was negative by patient report.Mom with focal rectal cancer at age 44.  For EGD/ colonoscopy.  Tolerated prep well.    Medications Prior to Admission  Medication Sig Dispense Refill Last Dose  . telmisartan-hydrochlorothiazide (MICARDIS HCT) 80-25 MG tablet Take 1 tablet by mouth daily. 90 tablet 0 10/07/2019 at Unknown time  . buPROPion (WELLBUTRIN XL) 300 MG 24 hr tablet Take 1 tablet (300 mg total) by mouth daily. 90 tablet 1   . cholecalciferol (VITAMIN D) 1000 UNITS tablet Take 1,000 Units by mouth daily.     . ferrous sulfate 325 (65 FE) MG tablet Take 1 tablet by mouth daily.     . fluticasone (FLONASE) 50 MCG/ACT nasal spray Place 2 sprays into both nostrils daily. 42 g 0   . loratadine (CLARITIN) 10 MG tablet Take 1 tablet by mouth as needed.     . Lorcaserin HCl ER (BELVIQ XR) 20 MG TB24 Take 1 tablet by mouth daily. 30 tablet 2   . metaxalone (SKELAXIN) 800 MG tablet Take 1 tablet (800 mg total) by mouth 3 (three) times daily. 30 tablet 0   . sertraline (ZOLOFT) 100 MG tablet Take 1 tablet (100 mg total) by mouth daily. 90 tablet 1   . temazepam (RESTORIL) 30 MG capsule Take 1 capsule (30 mg total) by mouth at bedtime as needed for sleep. 90 capsule 0    Allergies  Allergen Reactions  . Cephalosporins   . Ceftin [Cefuroxime Axetil] Rash   Past Medical History:  Diagnosis Date  . Allergy   . Anemia   . Barrett's esophagus   . Depression   . Diverticulosis   . DVT (deep venous thrombosis) ([place])   . Dysmenorrhea   . GERD (gastroesophageal reflux disease)   . History of hiatal hernia   . Hypertension   . Previous cesarean section   . PUD (peptic ulcer disease)   . Pulmonary emboli [facility])     Past Surgical History:  Procedure Laterality Date  . CESAREAN SECTION    . COLONOSCOPY    . ENDOMETRIAL BIOPSY  05/27/13   no hyperplasia or carcionam, weakly proliferative endometrium,stomal and or glandular breakdown- [facility]  . ESOPHAGOGASTRODUODENOSCOPY    . LEEP  2000  . PERIPHERAL VASCULAR CATHETERIZATION N/A 05/14/2014   Procedure: ivc filter removal;  Surgeon: Katha Cabal, MD;  Location: [place] [facility];  Service: Cardiovascular;  Laterality: N/A;  . TONSILLECTOMY    . TUBAL LIGATION    . uterine ablation    . VENOGRAM N/A 05/14/2014   Procedure: Venogram;  Surgeon: Katha Cabal, MD;  Location: [place] [facility];  Service: Cardiovascular;  Laterality: N/A;  IVC  . wisdom tooth removal     Social History   Socioeconomic History  . Marital status: Married    Spouse name: Not on file  . Number of children: Not on file  . Years of education: Not on file  . Highest education level: Not on file  Occupational History  . Not on file  Tobacco Use  . Smoking status: Former Research scientist (life sciences)  . Smokeless tobacco: Never Used  Vaping Use  . Vaping Use: Never used  Substance and Sexual Activity  . Alcohol  use: Yes    Alcohol/week: 0.0 standard drinks    Comment: Occasional  . Drug use: No  . Sexual activity: Yes    Partners: Male    Birth control/protection: None    Comment: Tubligation  Other Topics Concern  . Not on file  Social History Narrative  . Not on file   Social Determinants of Health   Financial Resource Strain:   . Difficulty of Paying Living Expenses: Not on file  Food Insecurity:   . Worried About Charity fundraiser in the Last Year: Not on file  . Ran Out of Food in the Last Year: Not on file  Transportation Needs:   . Lack of Transportation (Medical): Not on file  . Lack of Transportation (Non-Medical): Not on file  Physical Activity:   . Days of Exercise per Week: Not on file  . Minutes of Exercise per Session: Not on file  Stress:   .  Feeling of Stress : Not on file  Social Connections:   . Frequency of Communication with Friends and Family: Not on file  . Frequency of Social Gatherings with Friends and Family: Not on file  . Attends Religious Services: Not on file  . Active Member of Clubs or Organizations: Not on file  . Attends Archivist Meetings: Not on file  . Marital Status: Not on file  Intimate Partner Violence:   . Fear of Current or Ex-Partner: Not on file  . Emotionally Abused: Not on file  . Physically Abused: Not on file  . Sexually Abused: Not on file   Social History   Social History Narrative  . Not on file     ROS: Negative.     PE: HEENT: Negative. Lungs: Clear. Cardio: RR.  Assessment/Plan:  Proceed with planned endoscopy.  Forest Gleason [facility] 10/07/2019

## 2019-10-07 NOTE — Transfer of Care (Signed)
Immediate Anesthesia Transfer of Care Note  Patient: Brooke Page  Procedure(s) Performed: COLONOSCOPY WITH PROPOFOL (N/A ) ESOPHAGOGASTRODUODENOSCOPY (EGD) WITH PROPOFOL (N/A )  Patient Location: PACU  Anesthesia Type:General  Level of Consciousness: awake, alert  and oriented  Airway & Oxygen Therapy: Patient Spontanous Breathing and Patient connected to nasal cannula oxygen  Post-op Assessment: Report given to RN and Post -op Vital signs reviewed and stable  Post vital signs: Reviewed and stable  Last Vitals:  Vitals Value Taken Time  BP 143/98 10/07/19 0932  Temp    Pulse 110 10/07/19 0933  Resp 22 10/07/19 0933  SpO2 100 % 10/07/19 0933  Vitals shown include unvalidated device data.  Last Pain:  Vitals:   10/07/19 0757  TempSrc: Temporal  PainSc: 0-No pain         Complications: No complications documented.

## 2019-10-07 NOTE — Progress Notes (Signed)
   10/07/19 0745  Clinical Encounter Type  Visited With Patient and family together  Visit Type Initial  Referral From Chaplain  Consult/Referral To Chaplain  While rounding [facility] waiting area, chaplain briefly visited with Pt and her mother. Pt's mother said that she was great daughter.  Chaplain commented on what seem to be an awesome mother/daughter relationship. Chaplain told Pt's mother "the branch doesn't fall far from the tree." The daughter told her mother, "you are a great mom." As chaplain walked away, staff called Pt to the back.

## 2019-10-07 NOTE — Anesthesia Procedure Notes (Signed)
Date/Time: 10/07/2019 8:58 AM Performed by: Nelda Marseille, CRNA Pre-anesthesia Checklist: Patient identified, Emergency Drugs available, Suction available, Patient being monitored and Timeout performed Oxygen Delivery Method: Nasal cannula

## 2019-10-07 NOTE — Anesthesia Preprocedure Evaluation (Signed)
Anesthesia Evaluation  Patient identified by MRN, date of birth, ID band Patient awake    Reviewed: Allergy & Precautions, H&P , NPO status , Patient's Chart, lab work & pertinent test results, reviewed documented beta blocker date and time   Airway Mallampati: II   Neck ROM: full    Dental  (+) Teeth Intact   Pulmonary neg pulmonary ROS, former smoker,    Pulmonary exam normal        Cardiovascular Exercise Tolerance: Poor hypertension, On Medications negative cardio ROS Normal cardiovascular exam Rhythm:regular Rate:Normal     Neuro/Psych PSYCHIATRIC DISORDERS Depression negative neurological ROS  negative psych ROS   GI/Hepatic negative GI ROS, Neg liver ROS, hiatal hernia, PUD, GERD  Medicated,  Endo/Other  negative endocrine ROS  Renal/GU negative Renal ROS  negative genitourinary   Musculoskeletal   Abdominal   Peds  Hematology negative hematology ROS (+) Blood dyscrasia, anemia ,   Anesthesia Other Findings Past Medical History: No date: Allergy No date: Anemia No date: Barrett's esophagus No date: Depression No date: Diverticulosis No date: DVT (deep venous thrombosis) (HCC) No date: Dysmenorrhea No date: GERD (gastroesophageal reflux disease) No date: History of hiatal hernia No date: Hypertension No date: Previous cesarean section No date: PUD (peptic ulcer disease) No date: Pulmonary emboli (HCC) Past Surgical History: No date: ABDOMINAL HYSTERECTOMY No date: CESAREAN SECTION No date: COLONOSCOPY 05/27/13: ENDOMETRIAL BIOPSY     Comment:  no hyperplasia or carcionam, weakly proliferative               endometrium,stomal and or glandular breakdown- [facility] No date: ESOPHAGOGASTRODUODENOSCOPY 2000: LEEP 05/14/2014: PERIPHERAL VASCULAR CATHETERIZATION; N/A     Comment:  Procedure: ivc filter removal;  Surgeon: Katha Cabal, MD;  Location: [place] [facility];  Service:                Cardiovascular;  Laterality: N/A; No date: TONSILLECTOMY No date: TUBAL LIGATION 05/14/2014: VENOGRAM; N/A     Comment:  Procedure: Venogram;  Surgeon: Katha Cabal, MD;                Location: [place] [facility];  Service: Cardiovascular;              Laterality: N/A;  IVC No date: wisdom tooth removal   Reproductive/Obstetrics negative OB ROS                             Anesthesia Physical Anesthesia Plan  ASA: III  Anesthesia Plan: General   Post-op Pain Management:    Induction:   PONV Risk Score and Plan:   Airway Management Planned:   Additional Equipment:   Intra-op Plan:   Post-operative Plan:   Informed Consent: I have reviewed the patients History and Physical, chart, labs and discussed the procedure including the risks, benefits and alternatives for the proposed anesthesia with the patient or authorized representative who has indicated his/her understanding and acceptance.     Dental Advisory Given  Plan Discussed with: CRNA  Anesthesia Plan Comments:         Anesthesia Quick Evaluation

## 2019-10-07 NOTE — Op Note (Signed)
[facility] Gastroenterology Patient Name: Brooke Page Procedure Date: 10/07/2019 8:44 AM MRN: [id number] Account #: [id number] Date of Birth: 1970-12-19 Admit Type: Outpatient Age: 49 Room: [facility] ENDO ROOM 1 Gender: Female Note Status: Finalized Procedure:             Upper GI endoscopy Indications:           Dysphagia Providers:             Robert Bellow, MD Referring MD:          Bethena Roys. Sowles, MD (Referring MD) Medicines:             Monitored Anesthesia Care Complications:         No immediate complications. Procedure:             Pre-Anesthesia Assessment:                        - Prior to the procedure, a History and Physical was                         performed, and patient medications, allergies and                         sensitivities were reviewed. The patient's tolerance                         of previous anesthesia was reviewed.                        - The risks and benefits of the procedure and the                         sedation options and risks were discussed with the                         patient. All questions were answered and informed                         consent was obtained.                        After obtaining informed consent, the endoscope was                         passed under direct vision. Throughout the procedure,                         the patient's blood pressure, pulse, and oxygen                         saturations were monitored continuously. The Endoscope                         was introduced through the mouth, and advanced to the                         second part of duodenum. The upper GI endoscopy was  accomplished without difficulty. The patient tolerated                         the procedure well. Findings:      One benign-appearing, intrinsic severe stenosis was found 35 cm from the       incisors. This stenosis measured 8 mm (inner diameter) x less than one       cm  (in length). The stenosis was traversed. A TTS dilator was passed       through the scope. Dilation with a 15-16.5-18 mm balloon dilator was       performed to 15 mm, 16.5 mm and 18 mm. The dilation site was examined       and showed complete resolution of luminal narrowing. Estimated blood       loss was minimal.      A large hiatal hernia was present.      The examined duodenum was normal. Impression:            - Benign-appearing esophageal stenosis. Dilated.                        - Large hiatal hernia.                        - Normal examined duodenum.                        - No specimens collected. Recommendation:        - Full liquid diet today.                        - Use a proton pump inhibitor PO BID. Procedure Code(s):     --- Professional ---                        [phone], Esophagogastroduodenoscopy, flexible,                         transoral; with transendoscopic balloon dilation of                         esophagus (less than 30 mm diameter) Diagnosis Code(s):     --- Professional ---                        K22.2, Esophageal obstruction                        K44.9, Diaphragmatic hernia without obstruction or                         gangrene                        R13.10, Dysphagia, unspecified CPT copyright 2019 American Medical Association. All rights reserved. The codes documented in this report are preliminary and upon coder review may  be revised to meet current compliance requirements. Robert Bellow, MD 10/07/2019 9:11:48 AM This report has been signed electronically. Number of Addenda: 0 Note Initiated On: 10/07/2019 8:44 AM Estimated Blood Loss:  Estimated blood loss: none.      [facility]

## 2019-10-07 NOTE — Anesthesia Postprocedure Evaluation (Signed)
Anesthesia Post Note  Patient: Brooke Page  Procedure(s) Performed: COLONOSCOPY WITH PROPOFOL (N/A ) ESOPHAGOGASTRODUODENOSCOPY (EGD) WITH PROPOFOL (N/A )  Patient location during evaluation: PACU Anesthesia Type: General Level of consciousness: awake and alert Pain management: pain level controlled Vital Signs Assessment: post-procedure vital signs reviewed and stable Respiratory status: spontaneous breathing, nonlabored ventilation, respiratory function stable and patient connected to nasal cannula oxygen Cardiovascular status: blood pressure returned to baseline and stable Postop Assessment: no apparent nausea or vomiting Anesthetic complications: no   No complications documented.   Last Vitals:  Vitals:   10/07/19 0931 10/07/19 0941  BP: (!) 143/98   Pulse: (!) 113 92  Resp: (!) 21 (!) 21  Temp: (!) 36.1 C   SpO2: 100% 99%    Last Pain:  Vitals:   10/07/19 0941  TempSrc:   PainSc: 0-No pain                 Molli Barrows

## 2019-10-08 ENCOUNTER — Encounter: Payer: Self-pay | Admitting: General Surgery

## 2019-10-23 ENCOUNTER — Other Ambulatory Visit: Payer: Self-pay | Admitting: Gastroenterology

## 2019-10-23 DIAGNOSIS — K449 Diaphragmatic hernia without obstruction or gangrene: Secondary | ICD-10-CM

## 2019-10-23 DIAGNOSIS — K222 Esophageal obstruction: Secondary | ICD-10-CM

## 2019-10-29 ENCOUNTER — Other Ambulatory Visit: Payer: Self-pay

## 2019-10-29 ENCOUNTER — Ambulatory Visit
Admission: RE | Admit: 2019-10-29 | Discharge: 2019-10-29 | Disposition: A | Discharge status: Home / Self Care | Payer: 59 | Source: Ambulatory Visit | Attending: Gastroenterology | Admitting: Gastroenterology

## 2019-10-29 DIAGNOSIS — K222 Esophageal obstruction: Secondary | ICD-10-CM

## 2019-10-29 DIAGNOSIS — K449 Diaphragmatic hernia without obstruction or gangrene: Secondary | ICD-10-CM

## 2020-11-15 ENCOUNTER — Encounter: Payer: Self-pay | Admitting: Internal Medicine

## 2020-11-15 ENCOUNTER — Other Ambulatory Visit: Payer: Self-pay

## 2020-11-15 ENCOUNTER — Encounter: Payer: Self-pay | Admitting: Obstetrics and Gynecology

## 2020-11-15 ENCOUNTER — Ambulatory Visit (INDEPENDENT_AMBULATORY_CARE_PROVIDER_SITE_OTHER): Payer: 59 | Admitting: Internal Medicine

## 2020-11-15 VITALS — BP 145/97 | HR 84 | Ht 65.0 in | Wt 269.9 lb

## 2020-11-15 DIAGNOSIS — J302 Other seasonal allergic rhinitis: Secondary | ICD-10-CM

## 2020-11-15 DIAGNOSIS — G4700 Insomnia, unspecified: Secondary | ICD-10-CM

## 2020-11-15 DIAGNOSIS — Z124 Encounter for screening for malignant neoplasm of cervix: Secondary | ICD-10-CM | POA: Diagnosis not present

## 2020-11-15 DIAGNOSIS — I051 Rheumatic mitral insufficiency: Secondary | ICD-10-CM | POA: Diagnosis not present

## 2020-11-15 DIAGNOSIS — I10 Essential (primary) hypertension: Secondary | ICD-10-CM | POA: Diagnosis not present

## 2020-11-15 DIAGNOSIS — Z1231 Encounter for screening mammogram for malignant neoplasm of breast: Secondary | ICD-10-CM | POA: Diagnosis not present

## 2020-11-15 MED ORDER — TELMISARTAN-HCTZ 80-25 MG PO TABS: 1.0000 | ORAL_TABLET | Freq: Every day | ORAL | 2 refills | Status: DC

## 2020-11-15 NOTE — Assessment & Plan Note (Signed)
Patient denies any history of snoring

## 2020-11-15 NOTE — Assessment & Plan Note (Signed)
Take Claritin 5 mg p.o. daily 

## 2020-11-15 NOTE — Progress Notes (Signed)
New Patient Office Visit  Subjective:  Patient ID: Brooke Page, female    DOB: 1970/12/16  Age: 50 y.o. MRN: [id number]  CC:  Chief Complaint  Patient presents with   New Patient (Initial Visit)    HPI Patient presents for new pt  Past Medical History:  Diagnosis Date   Allergy    Anemia    Barrett's esophagus    Depression    Diverticulosis    DVT (deep venous thrombosis) (HCC)    Dysmenorrhea    GERD (gastroesophageal reflux disease)    History of hiatal hernia    Hypertension    Previous cesarean section    PUD (peptic ulcer disease)    Pulmonary emboli ([place])      Current Outpatient Medications:    fluticasone (FLONASE) 50 MCG/ACT nasal spray, Place 2 sprays into both nostrils daily., Disp: 42 g, Rfl: 0   loratadine (CLARITIN) 10 MG tablet, Take 1 tablet by mouth as needed., Disp: , Rfl:    telmisartan-hydrochlorothiazide (MICARDIS HCT) 80-25 MG tablet, Take 1 tablet by mouth daily., Disp: 90 tablet, Rfl: 2   Past Surgical History:  Procedure Laterality Date   CESAREAN SECTION     COLONOSCOPY     COLONOSCOPY WITH PROPOFOL N/A 10/07/2019   Procedure: COLONOSCOPY WITH PROPOFOL;  Surgeon: Robert Bellow, MD;  Location: [facility] ENDOSCOPY;  Service: Endoscopy;  Laterality: N/A;   ENDOMETRIAL BIOPSY  05/27/13   no hyperplasia or carcionam, weakly proliferative endometrium,stomal and or glandular breakdown- [facility]   ESOPHAGOGASTRODUODENOSCOPY     ESOPHAGOGASTRODUODENOSCOPY (EGD) WITH PROPOFOL N/A 10/07/2019   Procedure: ESOPHAGOGASTRODUODENOSCOPY (EGD) WITH PROPOFOL;  Surgeon: Robert Bellow, MD;  Location: [facility] ENDOSCOPY;  Service: Endoscopy;  Laterality: N/A;   LEEP  2000   PERIPHERAL VASCULAR CATHETERIZATION N/A 05/14/2014   Procedure: ivc filter removal;  Surgeon: Katha Cabal, MD;  Location: [place] [facility];  Service: Cardiovascular;  Laterality: N/A;   TONSILLECTOMY     TUBAL LIGATION     uterine ablation     VENOGRAM N/A 05/14/2014    Procedure: Venogram;  Surgeon: Katha Cabal, MD;  Location: [place] [facility];  Service: Cardiovascular;  Laterality: N/A;  IVC   wisdom tooth removal      Family History  Problem Relation Age of Onset   Cancer Mother        Colon and Breast   Hypertension Mother    Breast cancer Mother 19   Diabetes Mother    Diabetes Father    Hypertension Father    Hypercholesterolemia Father    Diabetes Brother    Pulmonary embolism Brother    Obesity Brother    Asthma Son     Social History   Socioeconomic History   Marital status: Divorced    Spouse name: Not on file   Number of children: Not on file   Years of education: Not on file   Highest education level: Not on file  Occupational History   Not on file  Tobacco Use   Smoking status: Every Day    Types: Cigarettes   Smokeless tobacco: Never  Vaping Use   Vaping Use: Never used  Substance and Sexual Activity   Alcohol use: Yes    Alcohol/week: 5.0 standard drinks    Types: 5 Standard drinks or equivalent per week    Comment: Occasional   Drug use: No   Sexual activity: Yes    Partners: Male    Birth control/protection: None  Comment: Tubligation  Other Topics Concern   Not on file  Social History Narrative   Not on file   Social Determinants of Health   Financial Resource Strain: Not on file  Food Insecurity: Not on file  Transportation Needs: Not on file  Physical Activity: Not on file  Stress: Not on file  Social Connections: Not on file  Intimate Partner Violence: Not on file    ROS Review of Systems  Constitutional:  Positive for fatigue. Negative for chills.  HENT:  Negative for nosebleeds.   Eyes:  Negative for redness.  Respiratory: Negative.    Cardiovascular: Negative.   Gastrointestinal: Negative.   Endocrine: Negative.   Genitourinary: Negative.   Musculoskeletal:  Positive for myalgias. Negative for arthralgias.  Skin: Negative.   Hematological: Negative.    Psychiatric/Behavioral: Negative.     Objective:   Today's Vitals: BP (!) 145/97   Pulse 84   Ht 5\' 5"  (1.651 m)   Wt 269 lb 14.4 oz (122.4 kg)   BMI 44.91 kg/m   Physical Exam Vitals reviewed.  Constitutional:      Appearance: She is obese.  HENT:     Nose: Nose normal.  Eyes:     Pupils: Pupils are equal, round, and reactive to light.  Cardiovascular:     Rate and Rhythm: Normal rate and regular rhythm.  Pulmonary:     Effort: Pulmonary effort is normal.  Abdominal:     General: There is no distension.     Tenderness: There is no abdominal tenderness.  Musculoskeletal:        General: Normal range of motion.     Cervical back: Normal range of motion.  Neurological:     General: No focal deficit present.  Psychiatric:        Mood and Affect: Mood normal.    Assessment & Plan:   Problem List Items Addressed This Visit       Cardiovascular and Mediastinum   Mitral regurgitation    Stable at the present time      Relevant Medications   telmisartan-hydrochlorothiazide (MICARDIS HCT) 80-25 MG tablet   Hypertension, benign    The following hypertensive lifestyle modification were recommended and discussed:  1. Limiting alcohol intake to less than 1 oz/day of ethanol:(24 oz of beer or 8 oz of wine or 2 oz of 100-proof whiskey). 2. Take baby ASA 81 mg daily. 3. Importance of regular aerobic exercise and losing weight. 4. Reduce dietary saturated fat and cholesterol intake for overall cardiovascular health. 5. Maintaining adequate dietary potassium, calcium, and magnesium intake. 6. Regular monitoring of the blood pressure. 7. Reduce sodium intake to less than 100 mmol/day (less than 2.3 gm of sodium or less than 6 gm of sodium choride)       Relevant Medications   telmisartan-hydrochlorothiazide (MICARDIS HCT) 80-25 MG tablet     Respiratory   Seasonal allergic rhinitis    Take Claritin 5 mg p.o. daily        Other   Controlled insomnia    Patient denies  any history of snoring      Obesity, morbid (HCC)    Behavioral modification strategies: increasing lean protein intake, decreasing simple carbohydrates, increasing vegetables, increasing water intake, decreasing eating out, no skipping meals, meal planning and cooking strategies, keeping healthy foods in the home and planning for success.      Other Visit Diagnoses     Encounter for screening mammogram for malignant neoplasm of breast    -  Primary   Relevant Orders   MM 3D SCREEN BREAST BILATERAL   Cervical cancer screening       Relevant Orders   Ambulatory referral to Obstetrics / Gynecology       Outpatient Encounter Medications as of 11/15/2020  Medication Sig   fluticasone (FLONASE) 50 MCG/ACT nasal spray Place 2 sprays into both nostrils daily.   loratadine (CLARITIN) 10 MG tablet Take 1 tablet by mouth as needed.   [DISCONTINUED] telmisartan-hydrochlorothiazide (MICARDIS HCT) 80-25 MG tablet Take 1 tablet by mouth daily.   telmisartan-hydrochlorothiazide (MICARDIS HCT) 80-25 MG tablet Take 1 tablet by mouth daily.   [DISCONTINUED] buPROPion (WELLBUTRIN XL) 300 MG 24 hr tablet Take 1 tablet (300 mg total) by mouth daily. (Patient not taking: Reported on 11/15/2020)   [DISCONTINUED] cholecalciferol (VITAMIN D) 1000 UNITS tablet Take 1,000 Units by mouth daily. (Patient not taking: Reported on 11/15/2020)   [DISCONTINUED] ferrous sulfate 325 (65 FE) MG tablet Take 1 tablet by mouth daily. (Patient not taking: Reported on 11/15/2020)   [DISCONTINUED] Lorcaserin HCl ER (BELVIQ XR) 20 MG TB24 Take 1 tablet by mouth daily. (Patient not taking: Reported on 11/15/2020)   [DISCONTINUED] metaxalone (SKELAXIN) 800 MG tablet Take 1 tablet (800 mg total) by mouth 3 (three) times daily. (Patient not taking: Reported on 11/15/2020)   [DISCONTINUED] sertraline (ZOLOFT) 100 MG tablet Take 1 tablet (100 mg total) by mouth daily. (Patient not taking: Reported on 11/15/2020)   [DISCONTINUED] sucralfate  (CARAFATE) 1 GM/10ML suspension Take 10 mLs (1 g total) by mouth 4 (four) times daily.   [DISCONTINUED] temazepam (RESTORIL) 30 MG capsule Take 1 capsule (30 mg total) by mouth at bedtime as needed for sleep. (Patient not taking: Reported on 11/15/2020)   No facility-administered encounter medications on file as of 11/15/2020.    Follow-up: No follow-ups on file.   Cletis Athens, MD

## 2020-11-15 NOTE — Assessment & Plan Note (Signed)

## 2020-11-15 NOTE — Assessment & Plan Note (Signed)
Stable at the present time. 

## 2020-11-15 NOTE — Assessment & Plan Note (Signed)
Behavioral modification strategies: increasing lean protein intake, decreasing simple carbohydrates, increasing vegetables, increasing water intake, decreasing eating out, no skipping meals, meal planning and cooking strategies, keeping healthy foods in the home and planning for success. 

## 2020-12-27 ENCOUNTER — Ambulatory Visit
Admission: RE | Admit: 2020-12-27 | Discharge: 2020-12-27 | Disposition: A | Discharge status: Home / Self Care | Payer: 59 | Source: Ambulatory Visit | Attending: Internal Medicine | Admitting: Internal Medicine

## 2020-12-27 ENCOUNTER — Other Ambulatory Visit: Payer: Self-pay

## 2020-12-27 DIAGNOSIS — Z1231 Encounter for screening mammogram for malignant neoplasm of breast: Secondary | ICD-10-CM | POA: Insufficient documentation

## 2021-02-15 ENCOUNTER — Ambulatory Visit: Payer: 59 | Admitting: Internal Medicine

## 2021-07-25 ENCOUNTER — Other Ambulatory Visit: Payer: Self-pay | Admitting: *Deleted

## 2021-07-25 MED ORDER — LOSARTAN POTASSIUM-HCTZ 100-25 MG PO TABS: 1.0000 | ORAL_TABLET | Freq: Every day | ORAL | 3 refills | Status: AC

## 2022-03-07 ENCOUNTER — Other Ambulatory Visit: Payer: Self-pay | Admitting: Internal Medicine

## 2022-03-07 DIAGNOSIS — Z1231 Encounter for screening mammogram for malignant neoplasm of breast: Secondary | ICD-10-CM

## 2022-03-27 ENCOUNTER — Ambulatory Visit
Admission: RE | Admit: 2022-03-27 | Discharge: 2022-03-27 | Disposition: A | Discharge status: Home / Self Care | Payer: 59 | Source: Ambulatory Visit | Attending: Internal Medicine | Admitting: Internal Medicine

## 2022-03-27 DIAGNOSIS — Z1231 Encounter for screening mammogram for malignant neoplasm of breast: Secondary | ICD-10-CM | POA: Diagnosis present

## 2023-06-11 ENCOUNTER — Other Ambulatory Visit: Payer: Self-pay | Admitting: Internal Medicine

## 2023-06-11 DIAGNOSIS — Z1231 Encounter for screening mammogram for malignant neoplasm of breast: Secondary | ICD-10-CM

## 2023-06-21 ENCOUNTER — Ambulatory Visit
Admission: RE | Admit: 2023-06-21 | Discharge: 2023-06-21 | Disposition: A | Discharge status: Home / Self Care | Source: Ambulatory Visit | Attending: Internal Medicine | Admitting: Internal Medicine

## 2023-06-21 DIAGNOSIS — Z1231 Encounter for screening mammogram for malignant neoplasm of breast: Secondary | ICD-10-CM | POA: Diagnosis present
